# Patient Record
Sex: Female | Born: 1980 | Race: White | Hispanic: No | Marital: Married | Smoking: Never smoker
Health system: Southern US, Community
[De-identification: ages and names within clinical notes are randomized; demographics above are authoritative.]

## PROBLEM LIST (undated history)

## (undated) DIAGNOSIS — Z1589 Genetic susceptibility to other disease: Secondary | ICD-10-CM

## (undated) DIAGNOSIS — F419 Anxiety disorder, unspecified: Secondary | ICD-10-CM

## (undated) DIAGNOSIS — D649 Anemia, unspecified: Secondary | ICD-10-CM

## (undated) DIAGNOSIS — N939 Abnormal uterine and vaginal bleeding, unspecified: Secondary | ICD-10-CM

## (undated) DIAGNOSIS — D6859 Other primary thrombophilia: Secondary | ICD-10-CM

## (undated) DIAGNOSIS — I10 Essential (primary) hypertension: Secondary | ICD-10-CM

## (undated) DIAGNOSIS — Z8742 Personal history of other diseases of the female genital tract: Secondary | ICD-10-CM

## (undated) DIAGNOSIS — Z973 Presence of spectacles and contact lenses: Secondary | ICD-10-CM

## (undated) DIAGNOSIS — D239 Other benign neoplasm of skin, unspecified: Secondary | ICD-10-CM

## (undated) DIAGNOSIS — N644 Mastodynia: Secondary | ICD-10-CM

## (undated) DIAGNOSIS — Z9889 Other specified postprocedural states: Secondary | ICD-10-CM

## (undated) DIAGNOSIS — R21 Rash and other nonspecific skin eruption: Secondary | ICD-10-CM

## (undated) DIAGNOSIS — R112 Nausea with vomiting, unspecified: Secondary | ICD-10-CM

## (undated) HISTORY — DX: Anxiety disorder, unspecified: F41.9

## (undated) HISTORY — DX: Other benign neoplasm of skin, unspecified: D23.9

## (undated) HISTORY — PX: BREAST ENHANCEMENT SURGERY: SHX7

## (undated) HISTORY — DX: Mastodynia: N64.4

## (undated) HISTORY — PX: TUBAL LIGATION: SHX77

## (undated) HISTORY — PX: MOLE REMOVAL: SHX2046

## (undated) HISTORY — DX: Personal history of other diseases of the female genital tract: Z87.42

---

## 2007-02-25 HISTORY — PX: LASER ABLATION OF THE CERVIX: SHX1949

## 2014-08-10 LAB — HM PAP SMEAR: HM PAP: NEGATIVE

## 2015-05-10 LAB — HM MAMMOGRAPHY

## 2017-04-17 ENCOUNTER — Ambulatory Visit (INDEPENDENT_AMBULATORY_CARE_PROVIDER_SITE_OTHER): Payer: No Typology Code available for payment source | Admitting: Physician Assistant

## 2017-04-17 ENCOUNTER — Encounter (INDEPENDENT_AMBULATORY_CARE_PROVIDER_SITE_OTHER): Payer: Self-pay

## 2017-04-17 ENCOUNTER — Encounter: Payer: Self-pay | Admitting: Physician Assistant

## 2017-04-17 VITALS — BP 114/78 | HR 71 | Ht 62.0 in | Wt 127.0 lb

## 2017-04-17 DIAGNOSIS — Z131 Encounter for screening for diabetes mellitus: Secondary | ICD-10-CM

## 2017-04-17 DIAGNOSIS — Z8742 Personal history of other diseases of the female genital tract: Secondary | ICD-10-CM | POA: Insufficient documentation

## 2017-04-17 DIAGNOSIS — Z130 Encounter for screening for diseases of the blood and blood-forming organs and certain disorders involving the immune mechanism: Secondary | ICD-10-CM

## 2017-04-17 DIAGNOSIS — Z0189 Encounter for other specified special examinations: Secondary | ICD-10-CM | POA: Diagnosis not present

## 2017-04-17 DIAGNOSIS — Z13220 Encounter for screening for lipoid disorders: Secondary | ICD-10-CM

## 2017-04-17 DIAGNOSIS — Z800 Family history of malignant neoplasm of digestive organs: Secondary | ICD-10-CM | POA: Insufficient documentation

## 2017-04-17 DIAGNOSIS — Z87898 Personal history of other specified conditions: Secondary | ICD-10-CM

## 2017-04-17 DIAGNOSIS — D225 Melanocytic nevi of trunk: Secondary | ICD-10-CM | POA: Diagnosis not present

## 2017-04-17 DIAGNOSIS — Z7689 Persons encountering health services in other specified circumstances: Secondary | ICD-10-CM | POA: Diagnosis not present

## 2017-04-17 DIAGNOSIS — Z008 Encounter for other general examination: Secondary | ICD-10-CM

## 2017-04-17 DIAGNOSIS — Z1329 Encounter for screening for other suspected endocrine disorder: Secondary | ICD-10-CM

## 2017-04-17 DIAGNOSIS — N644 Mastodynia: Secondary | ICD-10-CM

## 2017-04-17 NOTE — Progress Notes (Signed)
HPI:                                                                Jacqueline Huerta is a 37 y.o. female who presents to [facility]: [facility] today to establish care  Current concerns: biometric screening, abnormal mole  Mole: patient reports mole on her upper left back that has been present for an unknown amount of time. Husband noticed it and felt it was abnormal because it does not look like her other moles. It is nontender, non-pruritic, does not bleed. She has a history of dysplastic nevus on her back that was removed years ago.   Depression screen [facility] 2/9 04/17/2017  Decreased Interest 0  Down, Depressed, Hopeless 0  PHQ - 2 Score 0    No flowsheet data found.    Past Medical History:  Diagnosis Date  . Anxiety   . Breast tenderness in female   . Dysplastic nevus   . History of abnormal cervical Pap smear    Past Surgical History:  Procedure Laterality Date  . BREAST ENHANCEMENT SURGERY    . CESAREAN SECTION     x 2  . LASER ABLATION OF THE CERVIX  2009  . MOLE REMOVAL    . TUBAL LIGATION     Social History   Tobacco Use  . Smoking status: Never Smoker  . Smokeless tobacco: Never Used  Substance Use Topics  . Alcohol use: Yes    Alcohol/week: 0.6 oz    Types: 1 Standard drinks or equivalent per week   family history includes Alcohol abuse in her father; Colon cancer in her paternal grandmother; Heart attack in her maternal grandfather; Hyperlipidemia in her mother; Lung cancer in her maternal aunt.    ROS: Review of Systems  Cardiovascular: Positive for palpitations.  Musculoskeletal:       + b/l breast tenderness  Psychiatric/Behavioral: The patient is nervous/anxious.   All other systems reviewed and are negative.    Medications: Current Outpatient Medications  Medication Sig Dispense Refill  . Multiple Vitamins-Minerals (WOMENS MULTI PO) Take by mouth.     No current facility-administered medications  for this visit.    Allergies  Allergen Reactions  . Nitrofurantoin Nausea And Vomiting       Objective:  BP 114/78   Pulse 71   Ht 5\' 2"  (1.575 m)   Wt 127 lb (57.6 kg)   LMP 04/11/2017   BMI 23.23 kg/m  Gen:  alert, not ill-appearing, no distress, appropriate for age 45: head normocephalic without obvious abnormality, conjunctiva and cornea clear, trachea midline Pulm: Normal work of breathing, normal phonation Neuro: alert and oriented x 3, no tremor MSK: extremities atraumatic, normal gait and station Skin: approx 60mm pearly papule on the left upper back, well-healed excisional scar adjacent to the left thoracic spine, areas of sun damage are noted with scattered lentigines, brown flat macules, pigmented lesions with regular borders are noted on the posterior trunk Psych: well-groomed, cooperative, good eye contact, euthymic mood, affect mood-congruent, speech is articulate, and thought processes clear and goal-directed    No results found for this or any previous visit (from the past 72 hour(s)). No results found.    Assessment and Plan: 37 y.o. female with  1. Encounter to establish care - reviewed PMH, PSH, PFH, medications and allergies - reviewed health maintenance - Pap smear UTD per patient, requesting records from Dr. Evelina Bucy ([place]) - declines influenza - Tdap UTD per patient - negative PHQ2  2. Encounter for biometric screening - CBC - Comprehensive metabolic panel - Lipid Panel w/reflex Direct LDL - Nicotine and Cotinine LC/MS/MS, U  3. Family history of colon cancer - grandparent, no first degree relatives, no hereditary polyposis, routine screening beginning age 75  4. Atypical nevus of back - differential includes dermal nevus versus SCC/BCC. Given history of atypical nevi will refer to Derm for annual skin check - Ambulatory referral to Dermatology  5. History of abnormal cervical Pap smear - 10 years ago, recent Paps normal per  patient, requesting records - she would like to do annual screening due to her history. She understands this is outside of the clinical guidelines and she may have to pay for it  6. Breast tenderness in female - chronic tenderness in bilateral breasts. Augmentation surgery was 12 years ago. She reports normal mammograms and ultrasounds x 2 (requesting records). She plans to follow-up with her plastic surgeon in [place]  7. Screening for thyroid disorder - TSH + free T4  8. Screening for lipid disorders - Lipid Panel w/reflex Direct LDL  9. Screening for diabetes mellitus - Comprehensive metabolic panel  10. Screening for blood disease - CBC - Comprehensive metabolic panel   Patient education and anticipatory guidance given Patient agrees with treatment plan Follow-up in 1 month for Pap smear or sooner as needed if symptoms worsen or fail to improve  Darlyne Russian PA-C

## 2017-04-22 LAB — NICOTINE AND COTININE LC/MS/MS, U: NICOTINE, URINE: <2 ng/mL

## 2017-04-22 LAB — COMPREHENSIVE METABOLIC PANEL
AG Ratio: 1.8 (calc) (ref 1.0–2.5)
ALT: 14 U/L (ref 6–29)
AST: 16 U/L (ref 10–30)
Albumin: 4.6 g/dL (ref 3.6–5.1)
Alkaline phosphatase (APISO): 54 U/L (ref 33–115)
BUN: 12 mg/dL (ref 7–25)
CO2: 26 mmol/L (ref 20–32)
CREATININE: 0.84 mg/dL (ref 0.50–1.10)
Calcium: 9.5 mg/dL (ref 8.6–10.2)
Chloride: 104 mmol/L (ref 98–110)
GLUCOSE: 87 mg/dL (ref 65–99)
Globulin: 2.5 g/dL (calc) (ref 1.9–3.7)
Potassium: 4.5 mmol/L (ref 3.5–5.3)
SODIUM: 138 mmol/L (ref 135–146)
TOTAL PROTEIN: 7.1 g/dL (ref 6.1–8.1)
Total Bilirubin: 0.5 mg/dL (ref 0.2–1.2)

## 2017-04-22 LAB — CBC
HCT: 43.5 % (ref 35.0–45.0)
HEMOGLOBIN: 14.3 g/dL (ref 11.7–15.5)
MCH: 29.5 pg (ref 27.0–33.0)
MCHC: 32.9 g/dL (ref 32.0–36.0)
MCV: 89.7 fL (ref 80.0–100.0)
MPV: 13.3 fL — ABNORMAL HIGH (ref 7.5–12.5)
PLATELETS: 181 10*3/uL (ref 140–400)
RBC: 4.85 10*6/uL (ref 3.80–5.10)
RDW: 11.7 % (ref 11.0–15.0)
WBC: 4.4 10*3/uL (ref 3.8–10.8)

## 2017-04-22 LAB — LIPID PANEL W/REFLEX DIRECT LDL
Cholesterol: 131 mg/dL (ref <200)
HDL: 58 mg/dL (ref >50)
LDL CHOLESTEROL (CALC): 60 mg/dL
NON-HDL CHOLESTEROL (CALC): 73 mg/dL (ref <130)
TRIGLYCERIDES: 45 mg/dL (ref <150)
Total CHOL/HDL Ratio: 2.3 (calc) (ref <5.0)

## 2017-04-22 LAB — TSH+FREE T4: TSH W/REFLEX TO FT4: 0.96 m[IU]/L

## 2017-04-23 NOTE — Progress Notes (Signed)
Good morning Ashonti,  Your labs look great - normal kidney function - cholesterol in a healthy range - normal blood counts - no evidence of diabetes  Your form and results will be faxed to your employer and you can pick-up a hard-copy from the front desk by end of day today.  Best, Evlyn Clines

## 2017-05-15 ENCOUNTER — Ambulatory Visit: Payer: No Typology Code available for payment source | Admitting: Physician Assistant

## 2017-05-21 ENCOUNTER — Ambulatory Visit: Payer: No Typology Code available for payment source | Admitting: Physician Assistant

## 2017-05-29 ENCOUNTER — Other Ambulatory Visit (HOSPITAL_COMMUNITY)
Admission: RE | Admit: 2017-05-29 | Discharge: 2017-05-29 | Disposition: A | Discharge status: Home / Self Care | Payer: No Typology Code available for payment source | Source: Ambulatory Visit | Attending: Physician Assistant | Admitting: Physician Assistant

## 2017-05-29 ENCOUNTER — Encounter: Payer: Self-pay | Admitting: Physician Assistant

## 2017-05-29 ENCOUNTER — Ambulatory Visit (INDEPENDENT_AMBULATORY_CARE_PROVIDER_SITE_OTHER): Payer: No Typology Code available for payment source | Admitting: Physician Assistant

## 2017-05-29 VITALS — BP 118/78 | HR 76 | Wt 123.0 lb

## 2017-05-29 DIAGNOSIS — N644 Mastodynia: Secondary | ICD-10-CM

## 2017-05-29 DIAGNOSIS — Z9851 Tubal ligation status: Secondary | ICD-10-CM | POA: Diagnosis not present

## 2017-05-29 DIAGNOSIS — N923 Ovulation bleeding: Secondary | ICD-10-CM | POA: Diagnosis not present

## 2017-05-29 DIAGNOSIS — N926 Irregular menstruation, unspecified: Secondary | ICD-10-CM | POA: Insufficient documentation

## 2017-05-29 DIAGNOSIS — Z124 Encounter for screening for malignant neoplasm of cervix: Secondary | ICD-10-CM | POA: Diagnosis not present

## 2017-05-29 NOTE — Patient Instructions (Signed)

## 2017-05-29 NOTE — Progress Notes (Signed)
HPI:                                                                Jacqueline Huerta is a 37 y.o. female who presents to [place]: [place] today for Pap smear only  Current Concerns include:  History of breast augmentation 13 years ago. Reports history of right lower breast with palpable "pea-sized" mass just below the nipple. She has had negative mammogram and ultrasound. Reports no changes in the area of concern; she is actually not able to palpate it any more. Breasts are chronically tender in the inferior aspect bilaterally.    GYN/Sexual Health  Obstetrics: G2P2  Menstrual status: having periods  LMP: 05/07/17  Menses: irregular, monthly, some intermenstrual spotting  Last pap smear: 2 years ago, normal  History of abnormal pap smears: yes >10 years ago  Sexually active: yes, 1 female partner (husband)  Current contraception: tubal ligation  History of STI: no  Health Maintenance Health Maintenance  Topic Date Due  . PAP SMEAR  01/31/2004  . INFLUENZA VACCINE  12/18/2017 (Originally 09/24/2017)  . TETANUS/TDAP  04/17/2020    Past Medical History:  Diagnosis Date  . Anxiety   . Breast tenderness in female   . Dysplastic nevus   . History of abnormal cervical Pap smear    Past Surgical History:  Procedure Laterality Date  . BREAST ENHANCEMENT SURGERY    . CESAREAN SECTION     x 2  . LASER ABLATION OF THE CERVIX  2009  . MOLE REMOVAL    . TUBAL LIGATION     Social History   Tobacco Use  . Smoking status: Never Smoker  . Smokeless tobacco: Never Used  Substance Use Topics  . Alcohol use: Yes    Alcohol/week: 0.6 oz    Types: 1 Standard drinks or equivalent per week   family history includes Alcohol abuse in her father; Colon cancer in her paternal grandmother; Heart attack in her maternal grandfather; Hyperlipidemia in her mother; Lung cancer in her maternal aunt.  ROS: negative except as noted in the  HPI  Medications: Current Outpatient Medications  Medication Sig Dispense Refill  . Multiple Vitamins-Minerals (WOMENS MULTI PO) Take by mouth.     No current facility-administered medications for this visit.    Allergies  Allergen Reactions  . Nitrofurantoin Nausea And Vomiting       Objective:  BP 118/78   Pulse 76   Wt 123 lb (55.8 kg)   BMI 22.50 kg/m  Breast: bilateral implants present, no palpable masses, no skin dimpling, no nipple inversion GU: vulva without rashes or lesions, normal introitus and urethral meatus, vaginal mucosa without erythema, normal discharge, cervix non-friable without lesions Lymph: no axillary adenopathy  A chaperone was used for the Breast/GU portion of the exam, Izell Wheatland, CMA.    No results found for this or any previous visit (from the past 72 hour(s)). No results found.    Assessment and Plan: 37 y.o. female with   Encounter for Pap smear of cervix with HPV DNA cotesting - Plan: Cytology - PAP  Intermenstrual spotting  History of bilateral tubal ligation  Declines STI screening Will trial evening primrose oil for breast tenderness Plans to follow-up with her  Psychiatric nurse in [place]. Declines referral Patient education and anticipatory guidance given Patient agrees with treatment plan Follow-up based on Pap results or sooner as needed  Darlyne Russian PA-C

## 2017-06-02 LAB — CYTOLOGY - PAP
ADEQUACY: ABSENT
Diagnosis: NEGATIVE
HPV (WINDOPATH): NOT DETECTED

## 2017-06-02 NOTE — Progress Notes (Signed)
Hi Juliah,  Your Pap smear and HPV testing were both negative. Recommend repeat screening in 5 years.  Best, Evlyn Clines

## 2017-06-03 ENCOUNTER — Encounter: Payer: Self-pay | Admitting: Physician Assistant

## 2017-06-09 ENCOUNTER — Encounter: Payer: Self-pay | Admitting: Physician Assistant

## 2017-06-09 NOTE — Progress Notes (Signed)
Negative for intraepithelial lesion or malignancy.  

## 2017-06-11 ENCOUNTER — Telehealth: Payer: Self-pay

## 2017-06-11 NOTE — Telephone Encounter (Signed)
CMA call patient regarding missed called form patient wanted to know her PAP results   Patient did not answer but CMA left a detailed message per DPR & to call back if have any questions

## 2018-04-16 ENCOUNTER — Other Ambulatory Visit: Payer: Self-pay | Admitting: Physician Assistant

## 2018-04-16 DIAGNOSIS — Z13220 Encounter for screening for lipoid disorders: Secondary | ICD-10-CM

## 2018-04-16 DIAGNOSIS — Z130 Encounter for screening for diseases of the blood and blood-forming organs and certain disorders involving the immune mechanism: Secondary | ICD-10-CM

## 2018-04-16 DIAGNOSIS — Z0000 Encounter for general adult medical examination without abnormal findings: Secondary | ICD-10-CM

## 2018-04-16 DIAGNOSIS — Z131 Encounter for screening for diabetes mellitus: Secondary | ICD-10-CM

## 2018-04-17 LAB — COMPLETE METABOLIC PANEL WITH GFR
AG RATIO: 2.1 (calc) (ref 1.0–2.5)
ALT: 12 U/L (ref 6–29)
AST: 16 U/L (ref 10–30)
Albumin: 4.8 g/dL (ref 3.6–5.1)
Alkaline phosphatase (APISO): 55 U/L (ref 31–125)
BUN: 10 mg/dL (ref 7–25)
CO2: 28 mmol/L (ref 20–32)
Calcium: 9.6 mg/dL (ref 8.6–10.2)
Chloride: 103 mmol/L (ref 98–110)
Creat: 0.8 mg/dL (ref 0.50–1.10)
GFR, Est African American: 111 mL/min/{1.73_m2} (ref >=60)
GFR, Est Non African American: 96 mL/min/{1.73_m2} (ref >=60)
Globulin: 2.3 g/dL (calc) (ref 1.9–3.7)
Glucose, Bld: 78 mg/dL (ref 65–99)
POTASSIUM: 4.3 mmol/L (ref 3.5–5.3)
Sodium: 140 mmol/L (ref 135–146)
Total Bilirubin: 0.5 mg/dL (ref 0.2–1.2)
Total Protein: 7.1 g/dL (ref 6.1–8.1)

## 2018-04-17 LAB — LIPID PANEL W/REFLEX DIRECT LDL
Cholesterol: 127 mg/dL (ref <200)
HDL: 67 mg/dL (ref >=50)
LDL CHOLESTEROL (CALC): 47 mg/dL
Non-HDL Cholesterol (Calc): 60 mg/dL (calc) (ref <130)
TRIGLYCERIDES: 45 mg/dL (ref <150)
Total CHOL/HDL Ratio: 1.9 (calc) (ref <5.0)

## 2018-04-17 LAB — CBC
HCT: 42 % (ref 35.0–45.0)
Hemoglobin: 13.9 g/dL (ref 11.7–15.5)
MCH: 30.3 pg (ref 27.0–33.0)
MCHC: 33.1 g/dL (ref 32.0–36.0)
MCV: 91.7 fL (ref 80.0–100.0)
MPV: 13.4 fL — ABNORMAL HIGH (ref 7.5–12.5)
Platelets: 173 10*3/uL (ref 140–400)
RBC: 4.58 10*6/uL (ref 3.80–5.10)
RDW: 11.7 % (ref 11.0–15.0)
WBC: 4.1 10*3/uL (ref 3.8–10.8)

## 2018-04-21 ENCOUNTER — Ambulatory Visit (INDEPENDENT_AMBULATORY_CARE_PROVIDER_SITE_OTHER): Payer: No Typology Code available for payment source | Admitting: Physician Assistant

## 2018-04-21 ENCOUNTER — Ambulatory Visit (INDEPENDENT_AMBULATORY_CARE_PROVIDER_SITE_OTHER): Payer: No Typology Code available for payment source

## 2018-04-21 ENCOUNTER — Encounter: Payer: Self-pay | Admitting: Physician Assistant

## 2018-04-21 VITALS — BP 122/70 | HR 111 | Temp 98.3°F | Wt 129.0 lb

## 2018-04-21 DIAGNOSIS — N926 Irregular menstruation, unspecified: Secondary | ICD-10-CM

## 2018-04-21 DIAGNOSIS — R110 Nausea: Secondary | ICD-10-CM | POA: Diagnosis not present

## 2018-04-21 DIAGNOSIS — R1013 Epigastric pain: Secondary | ICD-10-CM

## 2018-04-21 DIAGNOSIS — R000 Tachycardia, unspecified: Secondary | ICD-10-CM

## 2018-04-21 DIAGNOSIS — R102 Pelvic and perineal pain unspecified side: Secondary | ICD-10-CM

## 2018-04-21 DIAGNOSIS — Z0189 Encounter for other specified special examinations: Secondary | ICD-10-CM

## 2018-04-21 DIAGNOSIS — Z0000 Encounter for general adult medical examination without abnormal findings: Secondary | ICD-10-CM | POA: Diagnosis not present

## 2018-04-21 LAB — POCT URINALYSIS DIPSTICK
Bilirubin, UA: NEGATIVE
Blood, UA: NEGATIVE
Glucose, UA: NEGATIVE
Ketones, UA: NEGATIVE
Leukocytes, UA: NEGATIVE
Nitrite, UA: NEGATIVE
PH UA: 7 (ref 5.0–8.0)
Protein, UA: NEGATIVE
Spec Grav, UA: 1.015 (ref 1.010–1.025)
Urobilinogen, UA: 0.2 E.U./dL

## 2018-04-21 LAB — POCT URINE PREGNANCY: PREG TEST UR: NEGATIVE

## 2018-04-21 MED ORDER — PANTOPRAZOLE SODIUM 40 MG PO TBEC: 40.0000 mg | DELAYED_RELEASE_TABLET | Freq: Every day | ORAL | 0 refills | Status: DC

## 2018-04-21 NOTE — Patient Instructions (Addendum)
Pelvic Pain, Female Pelvic pain is pain in your lower abdomen, below your belly button and between your hips. The pain may start suddenly (be acute), keep coming back (be recurring), or last a long time (become chronic). Pelvic pain that lasts longer than 6 months is considered chronic. Pelvic pain may affect your:  Reproductive organs.  Urinary system.  Digestive tract.  Musculoskeletal system. There are many potential causes of pelvic pain. Sometimes, the pain can be a result of digestive or urinary conditions, strained muscles or ligaments, or reproductive conditions. Sometimes the cause of pelvic pain is not known. Follow these instructions at home:   Take over-the-counter and prescription medicines only as told by your health care provider.  Rest as told by your health care provider.  Do not have sex if it hurts.  Keep a journal of your pelvic pain. Write down: ? When the pain started. ? Where the pain is located. ? What seems to make the pain better or worse, such as food or your period (menstrual cycle). ? Any symptoms you have along with the pain.  Keep all follow-up visits as told by your health care provider. This is important. Contact a health care provider if:  Medicine does not help your pain.  Your pain comes back.  You have new symptoms.  You have abnormal vaginal discharge or bleeding, including bleeding after menopause.  You have a fever or chills.  You are constipated.  You have blood in your urine or stool.  You have foul-smelling urine.  You feel weak or light-headed. Get help right away if:  You have sudden severe pain.  Your pain gets steadily worse.  You have severe pain along with fever, nausea, vomiting, or excessive sweating.  You lose consciousness. Summary  Pelvic pain is pain in your lower abdomen, below your belly button and between your hips.  There are many potential causes of pelvic pain.  Keep a journal of your pelvic  pain. This information is not intended to replace advice given to you by your health care provider. Make sure you discuss any questions you have with your health care provider. Document Released: 01/08/2004 Document Revised: 07/29/2017 Document Reviewed: 07/29/2017 Elsevier Interactive Patient Education  2019 [place] for Gastroesophageal Reflux Disease, Adult When you have gastroesophageal reflux disease (GERD), the foods you eat and your eating habits are very important. Choosing the right foods can help ease the discomfort of GERD. Consider working with a diet and nutrition specialist (dietitian) to help you make healthy food choices. What general guidelines should I follow?  Eating plan  Choose healthy foods low in fat, such as fruits, vegetables, whole grains, low-fat dairy products, and lean meat, fish, and poultry.  Eat frequent, small meals instead of three large meals each day. Eat your meals slowly, in a relaxed setting. Avoid bending over or lying down until 2-3 hours after eating.  Limit high-fat foods such as fatty meats or fried foods.  Limit your intake of oils, butter, and shortening to less than 8 teaspoons each day.  Avoid the following: ? Foods that cause symptoms. These may be different for different people. Keep a food diary to keep track of foods that cause symptoms. ? Alcohol. ? Drinking large amounts of liquid with meals. ? Eating meals during the 2-3 hours before bed.  Cook foods using methods other than frying. This may include baking, grilling, or broiling. Lifestyle  Maintain a healthy weight. Ask your health care  provider what weight is healthy for you. If you need to lose weight, work with your health care provider to do so safely.  Exercise for at least 30 minutes on 5 or more days each week, or as told by your health care provider.  Avoid wearing clothes that fit tightly around your waist and chest.  Do not use any products  that contain nicotine or tobacco, such as cigarettes and e-cigarettes. If you need help quitting, ask your health care provider.  Sleep with the head of your bed raised. Use a wedge under the mattress or blocks under the bed frame to raise the head of the bed. What foods are not recommended? The items listed may not be a complete list. Talk with your dietitian about what dietary choices are best for you. Grains Pastries or quick breads with added fat. [place] toast. Vegetables Deep fried vegetables. [place] fries. Any vegetables prepared with added fat. Any vegetables that cause symptoms. For some people this may include tomatoes and tomato products, chili peppers, onions and garlic, and horseradish. Fruits Any fruits prepared with added fat. Any fruits that cause symptoms. For some people this may include citrus fruits, such as oranges, grapefruit, pineapple, and lemons. Meats and other protein foods High-fat meats, such as fatty beef or pork, hot dogs, ribs, ham, sausage, salami and bacon. Fried meat or protein, including fried fish and fried chicken. Nuts and nut butters. Dairy Whole milk and chocolate milk. Sour cream. Cream. Ice cream. Cream cheese. Milk shakes. Beverages Coffee and tea, with or without caffeine. Carbonated beverages. Sodas. Energy drinks. Fruit juice made with acidic fruits (such as orange or grapefruit). Tomato juice. Alcoholic drinks. Fats and oils Butter. Margarine. Shortening. Ghee. Sweets and desserts Chocolate and cocoa. Donuts. Seasoning and other foods Pepper. Peppermint and spearmint. Any condiments, herbs, or seasonings that cause symptoms. For some people, this may include curry, hot sauce, or vinegar-based salad dressings. Summary  When you have gastroesophageal reflux disease (GERD), food and lifestyle choices are very important to help ease the discomfort of GERD.  Eat frequent, small meals instead of three large meals each day. Eat your meals slowly, in  a relaxed setting. Avoid bending over or lying down until 2-3 hours after eating.  Limit high-fat foods such as fatty meat or fried foods. This information is not intended to replace advice given to you by your health care provider. Make sure you discuss any questions you have with your health care provider. Document Released: 02/10/2005 Document Revised: 02/12/2016 Document Reviewed: 02/12/2016 Elsevier Interactive Patient Education  2019 [employer].

## 2018-04-21 NOTE — Progress Notes (Signed)
HPI:                                                                Jacqueline Huerta is a 38 y.o. female who presents to [place]: [facility] today for annual physical exam  Current Concerns include menstrual period  G2P2 LMP 04/14/18 Reports last 2 menstrual cycles (January and February) were lighter and shorter, lasting only about 1 day with spotting for an additional 1-2 days. Cycles were only 21 days apart where they are normally 4 weeks apart Typical menses lasts 7 days Has had increased breast tenderness/swelling a couple of weeks prior to the start of her period. This is different from her typical breast tenderness. Denies any masses or nipple discharge. Increased acne/breakouts and feeling more emotional Once a week having a sharp, left-sided pelvic pain that "takes my breath away," but only lasts several seconds Today she is feeling nauseated Took a negative home pregnancy in January No dyspareunia or post-coital bleeding No intermenstrual bleeding or spotting  Health Maintenance Health Maintenance  Topic Date Due  . INFLUENZA VACCINE  05/25/2018 (Originally 09/24/2017)  . TETANUS/TDAP  04/17/2020  . PAP SMEAR-Modifier  05/30/2022    Past Medical History:  Diagnosis Date  . Anxiety   . Breast tenderness in female   . Dysplastic nevus   . History of abnormal cervical Pap smear    Past Surgical History:  Procedure Laterality Date  . BREAST ENHANCEMENT SURGERY    . CESAREAN SECTION     x 2  . LASER ABLATION OF THE CERVIX  2009  . MOLE REMOVAL    . TUBAL LIGATION     Social History   Tobacco Use  . Smoking status: Never Smoker  . Smokeless tobacco: Never Used  Substance Use Topics  . Alcohol use: Yes    Alcohol/week: 1.0 standard drinks    Types: 1 Standard drinks or equivalent per week   family history includes Alcohol abuse in her father; Colon cancer in her paternal grandmother; Heart attack in her maternal  grandfather; Hyperlipidemia in her mother; Lung cancer in her maternal aunt.  ROS: negative except as noted in the HPI  Medications: Current Outpatient Medications  Medication Sig Dispense Refill  . Multiple Vitamins-Minerals (WOMENS MULTI PO) Take by mouth.    . pantoprazole (PROTONIX) 40 MG tablet Take 1 tablet (40 mg total) by mouth daily for 14 days. 30 tablet 0   No current facility-administered medications for this visit.    Allergies  Allergen Reactions  . Nitrofurantoin Nausea And Vomiting       Objective:  BP 136/79   Pulse (!) 114   Temp 98.3 F (36.8 C)   Wt 129 lb (58.5 kg)   LMP 04/14/2018   BMI 23.59 kg/m  General Appearance:  Alert, cooperative, no distress, appropriate for age                            Head:  Normocephalic, without obvious abnormality                             Eyes:  PERRL, EOM's intact, conjunctiva and cornea clear, wearing  contact lenses                             Ears:  TM pearly gray color and semitransparent, external ear canals normal, both ears                            Nose:  Nares symmetrical, mucosa pink                          Throat:  Lips, tongue, and mucosa are moist, pink, and intact; oropharynx clear, uvula midline; good dentition                             Neck:  Supple; symmetrical, trachea midline, no adenopathy; thyroid: no enlargement, symmetric, no tenderness/mass/nodules                             Back:  Symmetrical, no curvature, ROM normal               Chest/Breast: deferred                           Lungs:  Clear to auscultation bilaterally, respirations unlabored                             Heart:  Tachycardic rate & regular rhythm, S1 and S2 normal, no murmurs, rubs, or gallops                     Abdomen:  Soft, epigastric and LUQ tenderness as well as LLQ tenderness, no guarding or rigidity, no CVA tenderness         Musculoskeletal:  Tone and strength strong and symmetrical, all extremities; no joint  pain or edema, normal gait and station                            Lymphatic:  No adenopathy             Skin/Hair/Nails:  Skin warm, dry and intact, no rashes or abnormal dyspigmentation on limited exam                   Neurologic:  Alert and oriented x3, no cranial nerve deficits, sensation grossly intact, normal gait and station, no tremor Psych: well-groomed, cooperative, good eye contact, euthymic mood, affect mood-congruent, speech is articulate, and thought processes clear and goal-directed   Lab Results  Component Value Date   CREATININE 0.80 04/16/2018   BUN 10 04/16/2018   NA 140 04/16/2018   K 4.3 04/16/2018   CL 103 04/16/2018   CO2 28 04/16/2018   Lab Results  Component Value Date   ALT 12 04/16/2018   AST 16 04/16/2018   BILITOT 0.5 04/16/2018   Lab Results  Component Value Date   CHOL 127 04/16/2018   HDL 67 04/16/2018   LDLCALC 47 04/16/2018   TRIG 45 04/16/2018   CHOLHDL 1.9 04/16/2018     No results found for this or any previous visit (from the past 72 hour(s)). No results found.    Assessment and Plan: 38 y.o. female with   .Tamiya was  seen today for annual exam.  Diagnoses and all orders for this visit:  Encounter for annual physical exam  Encounter for screening for tobacco use -     Nicotine and Cotinine LC/MS/MS, U  Pelvic pain in female -     US PELVIC COMPLETE W TRANSVAGINAL AND TORSION R/O  Menstrual cycle disorder  Epigastric pain -     pantoprazole (PROTONIX) 40 MG tablet; Take 1 tablet (40 mg total) by mouth daily for 14 days.  Nausea without vomiting -     pantoprazole (PROTONIX) 40 MG tablet; Take 1 tablet (40 mg total) by mouth daily for 14 days.   - Personally reviewed PMH, PSH, PFH, medications, allergies, HM - Age-appropriate cancer screening: Pap UTD, 05/29/17 NILM, HPV negative - Influenza declined - Tdap UTD - PHQ2 negative - Routine fasting labs completed, reviewed with patient, no abnormalities  Pelvic  pain UA negative Urine hCG negative [place] ordered and scheduled for this afternoon  Nausea and epigastric pain Normal CMP and CBC Protonix 40 mg QD x 2 weeks  GERD diet  Tachycardia Patient admits to feeling nervous and drinking caffeine before this visit Counseled to reduce caffeine consumption, hydrate Recheck in 2 weeks  Patient education and anticipatory guidance given Patient agrees with treatment plan Follow-up in 2 weeks for nausea/epigastric pain or sooner as needed  Darlyne Russian PA-C

## 2018-04-23 LAB — NICOTINE AND COTININE LC/MS/MS, U
COTININE, URINE: <2 ng/mL
NICOTINE, URINE: <2 ng/mL

## 2018-04-27 ENCOUNTER — Encounter: Payer: Self-pay | Admitting: Physician Assistant

## 2018-04-27 DIAGNOSIS — R110 Nausea: Secondary | ICD-10-CM | POA: Insufficient documentation

## 2018-04-27 DIAGNOSIS — R000 Tachycardia, unspecified: Secondary | ICD-10-CM | POA: Insufficient documentation

## 2018-04-27 DIAGNOSIS — R1013 Epigastric pain: Secondary | ICD-10-CM | POA: Insufficient documentation

## 2018-05-03 ENCOUNTER — Ambulatory Visit (INDEPENDENT_AMBULATORY_CARE_PROVIDER_SITE_OTHER): Payer: No Typology Code available for payment source | Admitting: Obstetrics & Gynecology

## 2018-05-03 ENCOUNTER — Encounter: Payer: Self-pay | Admitting: Obstetrics & Gynecology

## 2018-05-03 VITALS — BP 117/79 | HR 95 | Ht 62.0 in | Wt 126.0 lb

## 2018-05-03 DIAGNOSIS — N926 Irregular menstruation, unspecified: Secondary | ICD-10-CM

## 2018-05-03 DIAGNOSIS — R102 Pelvic and perineal pain: Secondary | ICD-10-CM | POA: Diagnosis not present

## 2018-05-03 DIAGNOSIS — Z9851 Tubal ligation status: Secondary | ICD-10-CM

## 2018-05-03 DIAGNOSIS — N9989 Other postprocedural complications and disorders of genitourinary system: Secondary | ICD-10-CM | POA: Diagnosis not present

## 2018-05-03 NOTE — Patient Instructions (Signed)
Return to clinic for any scheduled appointments or for any gynecologic concerns as needed.   

## 2018-05-03 NOTE — Progress Notes (Addendum)
GYNECOLOGY OFFICE VISIT NOTE   History:  Jacqueline Huerta is a 38 y.o. [id number] here today for evaluation of abnormal menstrual periods and pelvic pain. Accompanied by her husband. Reports normal menstrual periods until the last two months, had shortened period length (lasted only one day) and the one in January was associated with acne and breast tenderness. Also gets this pulling/shooting pain sensation on her left side periodically, recent ultrasound showed enlarged left ovarian vein (6.42mm) concerning for possible pelvic congestion syndrome.  Patient feels she getting periodic pains since her tubal ligation eight years ago, wants to know if this is related to her Filshie clips being "slipped off". Wants imaging to evaluate this.  Of note, patient also mentioned being interested in tubal reversal surgery.  She denies any current abnormal vaginal discharge, bleeding, pelvic pain or other concerns.    Past Medical History:  Diagnosis Date  . Anxiety   . Breast tenderness in female   . Dysplastic nevus   . History of abnormal cervical Pap smear     Past Surgical History:  Procedure Laterality Date  . BREAST ENHANCEMENT SURGERY    . CESAREAN SECTION     x 2  . LASER ABLATION OF THE CERVIX  2009  . MOLE REMOVAL    . TUBAL LIGATION      The following portions of the patient's history were reviewed and updated as appropriate: allergies, current medications, past family history, past medical history, past social history, past surgical history and problem list.   Health Maintenance:  Normal pap and negative HRHPV in 05/29/2017.    Review of Systems:  Pertinent items noted in HPI and remainder of comprehensive ROS otherwise negative.  Physical Exam:  BP 117/79   Pulse 95   Ht 5\' 2"  (1.575 m)   Wt 126 lb (57.2 kg)   LMP 04/14/2018   BMI 23.05 kg/m  CONSTITUTIONAL: Well-developed, well-nourished female in no acute distress.  HEENT:  Normocephalic, atraumatic. External right and left  ear normal. No scleral icterus.  NECK: Normal range of motion, supple, no masses noted on observation SKIN: No rash noted. Not diaphoretic. No erythema. No pallor. MUSCULOSKELETAL: Normal range of motion. No edema noted. NEUROLOGIC: Alert and oriented to person, place, and time. Normal muscle tone coordination. No cranial nerve deficit noted. PSYCHIATRIC: Normal mood and affect. Normal behavior. Normal judgment and thought content. CARDIOVASCULAR: Normal heart rate noted RESPIRATORY: Effort and breath sounds normal, no problems with respiration noted ABDOMEN: No masses noted. No other overt distention noted.   PELVIC: Deferred  Labs and Imaging No results found for this or any previous visit (from the past 168 hour(s)). US Pelvic Complete W Transvaginal And Torsion R/o  Result Date: 04/21/2018 CLINICAL DATA:  Initial evaluation for intermittent left-sided pelvic pain for 2 months. History of tubal ligation, ablation, C-section. EXAM: TRANSABDOMINAL AND TRANSVAGINAL ULTRASOUND OF PELVIS DOPPLER ULTRASOUND OF OVARIES TECHNIQUE: Both transabdominal and transvaginal ultrasound examinations of the pelvis were performed. Transabdominal technique was performed for global imaging of the pelvis including uterus, ovaries, adnexal regions, and pelvic cul-de-sac. It was necessary to proceed with endovaginal exam following the transabdominal exam to visualize the uterus, endometrium, and ovaries. Color and duplex Doppler ultrasound was utilized to evaluate blood flow to the ovaries. COMPARISON:  None. FINDINGS: Uterus Measurements: 6.4 x 3.9 x 4.8 cm = volume: 63 mL. No fibroids or other mass visualized. Endometrium Thickness: 15 mm.  No focal abnormality visualized. Right ovary Measurements: 2.9 x 2.0 x 2.3 cm =  volume: 7 mL. Normal appearance/no adnexal mass. 1.4 cm dominant follicle noted. Left ovary Measurements: 3.4 x 1.6 x 2.6 cm = volume: 7 mL. Normal appearance/no adnexal mass. Pulsed Doppler evaluation of  both ovaries demonstrates normal low-resistance arterial and venous waveforms. Other findings No abnormal free fluid. Prominent 6.1 mm vein noted within the left adnexa, raising the possibility for pelvic congestion syndrome. IMPRESSION: 1. Prominent vasculature within the left adnexa, raising the possibility for pelvic congestion syndrome. 2. Otherwise unremarkable and normal pelvic ultrasound for age. No evidence for ovarian torsion. Electronically Signed   By: Jeannine Boga M.D.   On: 04/21/2018 14:56     Assessment and Plan:     1. Menstrual cycle disorder Reassured patient that the shortening of her cycle could be secondary to stress or any other factors that can interfere with the hypothalamus-pituitary-ovarian-uterus axis. She wanted evaluation, lab evaluation done today. Will follow up results and manage accordingly. - Follicle stimulating hormone - TSH+Prl+TestT+TestF+17OHP - Hemoglobin A1c - B-HCG Quant  2. Pelvic pain in female 3. Post-tubal ligation syndrome Unclear if dilated vein is causing this, patient also worried about Filshie clips. Discussed post-tubal ligation syndrome of pelvic pain, occurs more duing periods. There is no imaging that is indicated to llok at Filshie clip location, they are likely on either ligated end of adjacent to ligated tubes.  Given patient's desire for possible tubal reversal surgery, she was referred to [facility] Kerin Perna). He may order imaging vs diagnostic laparoscopy to evaluate her tubes and will tell her exactly where her clips are at that point. If she does not proceed with this surgery, she can always return to get laparoscopy here and possible salpingectomy to help with her symptoms (if the pain is because of the Filshie clips). She was told there is a a large possibility pain would persist after this; also we do not offer any surgical intervention for dilated ovarian veins (oophorectomy not indicated). - Ambulatory referral to  Endocrinology  Return for any gynecologic concerns.    Total face-to-face time with patient: 30 minutes.  Over 50% of encounter was spent on counseling and coordination of care.   Verita Schneiders, MD, [place] [facility] [employer], [place]

## 2018-05-04 LAB — HEMOGLOBIN A1C
Hgb A1c MFr Bld: 5 % of total Hgb (ref <5.7)
Mean Plasma Glucose: 97 (calc)
eAG (mmol/L): 5.4 (calc)

## 2018-05-04 LAB — HCG, QUANTITATIVE, PREGNANCY

## 2018-05-04 LAB — FOLLICLE STIMULATING HORMONE: FSH: 3.2 m[IU]/mL

## 2018-05-06 LAB — TSH+PRL+TESTT+TESTF+17OHP
17 HYDROXYPROGESTERONE: 192 ng/dL
Prolactin: 10.6 ng/mL (ref 4.8–23.3)
TSH: 1.63 u[IU]/mL (ref 0.450–4.500)
Testosterone, Free: 3.9 pg/mL (ref 0.0–4.2)
Testosterone, Total, LC/MS: 36.8 ng/dL (ref 10.0–55.0)

## 2018-07-12 ENCOUNTER — Encounter: Payer: Self-pay | Admitting: Physician Assistant

## 2018-07-13 ENCOUNTER — Encounter: Payer: Self-pay | Admitting: Family Medicine

## 2018-07-13 ENCOUNTER — Ambulatory Visit (INDEPENDENT_AMBULATORY_CARE_PROVIDER_SITE_OTHER): Payer: BLUE CROSS/BLUE SHIELD | Admitting: Family Medicine

## 2018-07-13 VITALS — Ht 62.0 in | Wt 125.0 lb

## 2018-07-13 DIAGNOSIS — B373 Candidiasis of vulva and vagina: Secondary | ICD-10-CM

## 2018-07-13 DIAGNOSIS — B3731 Acute candidiasis of vulva and vagina: Secondary | ICD-10-CM

## 2018-07-13 MED ORDER — FLUCONAZOLE 150 MG PO TABS: 150.0000 mg | ORAL_TABLET | Freq: Once | ORAL | 1 refills | Status: AC

## 2018-07-13 NOTE — Progress Notes (Signed)
sxs x 3 days white discharge, burning itching.  She took AZO? for this and this did not work. Denies f/s/c/n/v/d. Pt is married.   Pt has Hx of endometriosis and PTLS that causes abdominal pain. She will have her surgery for this once pandemic is clear.Jacqueline Huerta, [place] Crocker

## 2018-07-13 NOTE — Progress Notes (Signed)
Virtual Visit via Video Note  I connected with Jacqueline Huerta on 07/13/18 at 10:30 AM EDT by a video enabled telemedicine application and verified that I am speaking with the correct person using two identifiers.   I discussed the limitations of evaluation and management by telemedicine and the availability of in person appointments. The patient expressed understanding and agreed to proceed.  Subjective:    CC:   HPI:  38 yo female c/o sxs x 3 days white discharge, burning itching.  She took AZO for yeast? for this and this did not work. Denies f/s/c/n/v/d. Pt is married. No dysuria.  No new sexual partners.    Pt has Hx of endometriosis and PTLS that causes abdominal pain. She will have her surgery for this once pandemic is clear   Past medical history, Surgical history, Family history not pertinant except as noted below, Social history, Allergies, and medications have been entered into the medical record, reviewed, and corrections made.   Review of Systems: No fevers, chills, night sweats, weight loss, chest pain, or shortness of breath.   Objective:    General: Speaking clearly in complete sentences without any shortness of breath.  Alert and oriented x3.  Normal judgment. No apparent acute distress.    Impression and Recommendations:   Vaginitis, yeast -symptoms seem very consistent with yeast vaginitis.  No red flag symptoms.  No fevers chills abdominal pain or new sexual partner.  We will go ahead and treat with Diflucan.  If she is not better in 2 to 3 days then please let us know and we will have her come in and do further evaluation including vaginal swab etc.  Does not sound she is having any urinary symptoms currently.     I discussed the assessment and treatment plan with the patient. The patient was provided an opportunity to ask questions and all were answered. The patient agreed with the plan and demonstrated an understanding of the instructions.   The patient was  advised to call back or seek an in-person evaluation if the symptoms worsen or if the condition fails to improve as anticipated.   Beatrice Lecher, MD

## 2018-08-17 ENCOUNTER — Telehealth: Payer: Self-pay | Admitting: *Deleted

## 2018-08-17 ENCOUNTER — Encounter: Payer: Self-pay | Admitting: Physician Assistant

## 2018-08-17 DIAGNOSIS — Z01818 Encounter for other preprocedural examination: Secondary | ICD-10-CM

## 2018-08-17 DIAGNOSIS — Z20822 Contact with and (suspected) exposure to covid-19: Secondary | ICD-10-CM

## 2018-08-17 NOTE — Telephone Encounter (Signed)
Pt referred for testing via MyChart by [facility]. Pt needs COVID-19 testing for pre op.  Pt called and scheduled for testing on 09/13/18 as requested by surgical facility.Pt given address and advised to wear a mask and remain in car at appt time. Pt verbalized understanding. Pt is due to have surgery on 09/16/18.Pt advised that typically results take 48-72 hours but may be longer. Pt will contact surgical facility to see if testing could be done earlier to ensure results are received before surgical date. Pt will return call tomorrow once she speaks with surgical facility.

## 2018-08-31 NOTE — Telephone Encounter (Addendum)
The pt called back and would like to have her testing done on 09/08/2018 due to the longer result time, and she needs them prior to her procedure on 09/17/2018; pt accepted appointment at [facility] site 09/08/2018 at 0800; pt reminded to wear mask to testing site; she verbalized understanding.

## 2018-09-08 ENCOUNTER — Other Ambulatory Visit: Payer: BLUE CROSS/BLUE SHIELD

## 2018-09-08 DIAGNOSIS — Z20822 Contact with and (suspected) exposure to covid-19: Secondary | ICD-10-CM

## 2018-09-13 ENCOUNTER — Other Ambulatory Visit: Payer: BLUE CROSS/BLUE SHIELD

## 2019-02-02 ENCOUNTER — Ambulatory Visit (INDEPENDENT_AMBULATORY_CARE_PROVIDER_SITE_OTHER): Payer: BC Managed Care – PPO | Admitting: Family Medicine

## 2019-02-02 ENCOUNTER — Encounter: Payer: Self-pay | Admitting: Family Medicine

## 2019-02-02 VITALS — Temp 97.7°F | Ht 62.0 in | Wt 129.0 lb

## 2019-02-02 DIAGNOSIS — R300 Dysuria: Secondary | ICD-10-CM

## 2019-02-02 MED ORDER — PHENAZOPYRIDINE HCL 200 MG PO TABS: 200.0000 mg | ORAL_TABLET | Freq: Three times a day (TID) | ORAL | 0 refills | Status: DC | PRN

## 2019-02-02 MED ORDER — AMOXICILLIN-POT CLAVULANATE 875-125 MG PO TABS: 1.0000 | ORAL_TABLET | Freq: Two times a day (BID) | ORAL | 0 refills | Status: DC

## 2019-02-02 NOTE — Progress Notes (Signed)
Virtual Visit via Video Note  I connected with Jacqueline Huerta on 02/02/19 at 10:50 AM EST by a video enabled telemedicine application and verified that I am speaking with the correct person using two identifiers.   I discussed the limitations of evaluation and management by telemedicine and the availability of in person appointments. The patient expressed understanding and agreed to proceed.  Subjective:    CC: dysuria   HPI:  Pt reports that her sxs night before last. She stated that it feels uncomfortable and painful after she urinates. She hasn't noticed any blood in her urine. Denies f/s/c/n/v, abdominal, back or flank pain.  Urine has an odor to it.    She informed me that she is trying to conceive and doesn't want anything to interfere with her trying to conceive. She asked about taking Pyridium for the bladder pain/pressure. No hx of recurrent UTIs.   She is allergic to Nitrofurantion -GI upset.   Past medical history, Surgical history, Family history not pertinant except as noted below, Social history, Allergies, and medications have been entered into the medical record, reviewed, and corrections made.   Review of Systems: No fevers, chills, night sweats, weight loss, chest pain, or shortness of breath.   Objective:    General: Speaking clearly in complete sentences without any shortness of breath.  Alert and oriented x3.  Normal judgment. No apparent acute distress.    Impression and Recommendations:    Dysuria-most consistent with urinary tract infection.  It does not sound recurrent.  No red flag symptoms.  We will go ahead and treat with Augmentin since she is trying to get pregnant.  We will also send her prescription for Pyridium.  If not improving please call us back so that we can get a urinalysis performed as well as culture.  If she develops new symptoms such as fever, nausea, or back pain then please call us sooner rather than later.  Sure hydrating  well.    I discussed the assessment and treatment plan with the patient. The patient was provided an opportunity to ask questions and all were answered. The patient agreed with the plan and demonstrated an understanding of the instructions.   The patient was advised to call back or seek an in-person evaluation if the symptoms worsen or if the condition fails to improve as anticipated.   Beatrice Lecher, MD

## 2019-02-02 NOTE — Progress Notes (Signed)
Pt reports that her sxs began yesterday. She stated that it feels uncomfortable and painful after she urinates. She hasn't noticed any blood in her urine. Denies f/s/c/n/v, abdominal,back or flank pain.  She informed me that she is trying to conceive and doesn't want anything to interfere with her trying to conceive. She asked about taking Pyridium for the bladder pain/pressure.  She is allergic to Nitrofurantion -GI upset.

## 2019-04-27 DIAGNOSIS — O09529 Supervision of elderly multigravida, unspecified trimester: Secondary | ICD-10-CM | POA: Insufficient documentation

## 2019-04-27 LAB — OB RESULTS CONSOLE HEPATITIS B SURFACE ANTIGEN: Hepatitis B Surface Ag: NEGATIVE

## 2019-04-27 LAB — OB RESULTS CONSOLE RPR: RPR: NONREACTIVE

## 2019-04-27 LAB — OB RESULTS CONSOLE ABO/RH: RH Type: POSITIVE

## 2019-04-27 LAB — OB RESULTS CONSOLE GC/CHLAMYDIA
Chlamydia: NEGATIVE
Gonorrhea: NEGATIVE

## 2019-04-27 LAB — OB RESULTS CONSOLE RUBELLA ANTIBODY, IGM: Rubella: IMMUNE

## 2019-04-27 LAB — OB RESULTS CONSOLE ANTIBODY SCREEN: Antibody Screen: NEGATIVE

## 2019-04-27 LAB — OB RESULTS CONSOLE HIV ANTIBODY (ROUTINE TESTING): HIV: NONREACTIVE

## 2019-08-02 ENCOUNTER — Inpatient Hospital Stay (HOSPITAL_COMMUNITY): Payer: BC Managed Care – PPO

## 2019-08-02 ENCOUNTER — Encounter (HOSPITAL_COMMUNITY): Payer: Self-pay | Admitting: Obstetrics and Gynecology

## 2019-08-02 ENCOUNTER — Inpatient Hospital Stay (HOSPITAL_COMMUNITY)
Admission: AD | Admit: 2019-08-02 | Discharge: 2019-08-02 | Disposition: A | Discharge status: Home / Self Care | Payer: BC Managed Care – PPO | Attending: Obstetrics and Gynecology | Admitting: Obstetrics and Gynecology

## 2019-08-02 ENCOUNTER — Other Ambulatory Visit: Payer: Self-pay

## 2019-08-02 DIAGNOSIS — R7401 Elevation of levels of liver transaminase levels: Secondary | ICD-10-CM | POA: Diagnosis not present

## 2019-08-02 DIAGNOSIS — R1010 Upper abdominal pain, unspecified: Secondary | ICD-10-CM

## 2019-08-02 DIAGNOSIS — R1011 Right upper quadrant pain: Secondary | ICD-10-CM | POA: Diagnosis not present

## 2019-08-02 DIAGNOSIS — O26892 Other specified pregnancy related conditions, second trimester: Secondary | ICD-10-CM | POA: Diagnosis present

## 2019-08-02 DIAGNOSIS — O09522 Supervision of elderly multigravida, second trimester: Secondary | ICD-10-CM | POA: Diagnosis not present

## 2019-08-02 DIAGNOSIS — Z3A23 23 weeks gestation of pregnancy: Secondary | ICD-10-CM

## 2019-08-02 LAB — COMPREHENSIVE METABOLIC PANEL
ALT: 59 U/L — ABNORMAL HIGH (ref 0–44)
AST: 38 U/L (ref 15–41)
Albumin: 3.3 g/dL — ABNORMAL LOW (ref 3.5–5.0)
Alkaline Phosphatase: 82 U/L (ref 38–126)
Anion gap: 9 (ref 5–15)
BUN: 6 mg/dL (ref 6–20)
CO2: 22 mmol/L (ref 22–32)
Calcium: 8.7 mg/dL — ABNORMAL LOW (ref 8.9–10.3)
Chloride: 104 mmol/L (ref 98–111)
Creatinine, Ser: 0.54 mg/dL (ref 0.44–1.00)
GFR calc Af Amer: >60 mL/min (ref >60)
GFR calc non Af Amer: >60 mL/min (ref >60)
Glucose, Bld: 79 mg/dL (ref 70–99)
Potassium: 3.6 mmol/L (ref 3.5–5.1)
Sodium: 135 mmol/L (ref 135–145)
Total Bilirubin: 0.4 mg/dL (ref 0.3–1.2)
Total Protein: 6.4 g/dL — ABNORMAL LOW (ref 6.5–8.1)

## 2019-08-02 LAB — CBC WITH DIFFERENTIAL/PLATELET
Abs Immature Granulocytes: 0.1 10*3/uL — ABNORMAL HIGH (ref 0.00–0.07)
Basophils Absolute: 0 10*3/uL (ref 0.0–0.1)
Basophils Relative: 0 %
Eosinophils Absolute: 0.1 10*3/uL (ref 0.0–0.5)
Eosinophils Relative: 1 %
HCT: 37.7 % (ref 36.0–46.0)
Hemoglobin: 12.3 g/dL (ref 12.0–15.0)
Immature Granulocytes: 1 %
Lymphocytes Relative: 11 %
Lymphs Abs: 1.3 10*3/uL (ref 0.7–4.0)
MCH: 31 pg (ref 26.0–34.0)
MCHC: 32.6 g/dL (ref 30.0–36.0)
MCV: 95 fL (ref 80.0–100.0)
Monocytes Absolute: 0.7 10*3/uL (ref 0.1–1.0)
Monocytes Relative: 6 %
Neutro Abs: 9.5 10*3/uL — ABNORMAL HIGH (ref 1.7–7.7)
Neutrophils Relative %: 81 %
Platelets: 178 10*3/uL (ref 150–400)
RBC: 3.97 MIL/uL (ref 3.87–5.11)
RDW: 12.6 % (ref 11.5–15.5)
WBC: 11.8 10*3/uL — ABNORMAL HIGH (ref 4.0–10.5)
nRBC: 0 % (ref 0.0–0.2)

## 2019-08-02 NOTE — MAU Provider Note (Signed)
History     CSN: [id number]  Arrival date and time: 08/02/19 1234   None     Chief Complaint  Patient presents with  . Abdominal Pain   Jacqueline Huerta is a  39 y.o. G3P2002 at [redacted]w[redacted]d who presents today with RUQ pain. She states that she has had this pain for apprx 1 month. She reports that she has been taking tylenol each night for about one month. She is also using a heating pad. She states that this helps. She denies any contractions, VB or LOF. She reports normal fetal movement.   Abdominal Pain This is a new problem. The current episode started 1 to 4 weeks ago. The problem occurs intermittently. The problem has been unchanged. The pain is located in the RUQ. The pain is moderate. The quality of the pain is aching. The abdominal pain radiates to the back. Nothing aggravates the pain. The pain is relieved by nothing. She has tried acetaminophen (heating pad ) for the symptoms. The treatment provided moderate relief.    OB History    Gravida  3   Para  2   Term  2   Preterm      AB      Living  2     SAB      TAB      Ectopic      Multiple      Live Births              Past Medical History:  Diagnosis Date  . Anxiety   . Breast tenderness in female   . Dysplastic nevus   . History of abnormal cervical Pap smear     Past Surgical History:  Procedure Laterality Date  . BREAST ENHANCEMENT SURGERY    . CESAREAN SECTION     x 2  . LASER ABLATION OF THE CERVIX  2009  . MOLE REMOVAL    . TUBAL LIGATION      Family History  Problem Relation Age of Onset  . Hyperlipidemia Mother   . Alcohol abuse Father   . Lung cancer Maternal Aunt   . Heart attack Maternal Grandfather   . Colon cancer Paternal Grandmother     Social History   Tobacco Use  . Smoking status: Never Smoker  . Smokeless tobacco: Never Used  Substance Use Topics  . Alcohol use: Yes    Alcohol/week: 1.0 standard drinks    Types: 1 Standard drinks or equivalent per week  .  Drug use: No    Allergies:  Allergies  Allergen Reactions  . Nitrofurantoin Nausea And Vomiting    Medications Prior to Admission  Medication Sig Dispense Refill Last Dose  . amoxicillin-clavulanate (AUGMENTIN) 875-125 MG tablet Take 1 tablet by mouth 2 (two) times daily. 6 tablet 0   . phenazopyridine (PYRIDIUM) 200 MG tablet Take 1 tablet (200 mg total) by mouth 3 (three) times daily as needed for pain. 20 tablet 0   . Prenatal Vit-Fe Fumarate-FA (PRENATAL VITAMIN PO) Take 1 capsule by mouth daily.       Review of Systems  Gastrointestinal: Positive for abdominal pain.  All other systems reviewed and are negative.  Physical Exam   Blood pressure 105/64, pulse 94, temperature 98.6 F (37 C), temperature source Oral, resp. rate 18, height 5\' 2"  (1.575 m), weight 71.1 kg, SpO2 100 %.  Physical Exam  Nursing note and vitals reviewed. Constitutional: She is oriented to person, place, and time. She appears well-developed and  well-nourished. No distress.  HENT:  Head: Normocephalic.  Cardiovascular: Normal rate.  Respiratory: Effort normal.  Neurological: She is alert and oriented to person, place, and time.  Skin: Skin is warm and dry.  Psychiatric: She has a normal mood and affect.   +FHT 160 with doppler   Results for orders placed or performed during the hospital encounter of 08/02/19 (from the past 24 hour(s))  Comprehensive metabolic panel     Status: Abnormal   Collection Time: 08/02/19  1:10 PM  Result Value Ref Range   Sodium 135 135 - 145 mmol/L   Potassium 3.6 3.5 - 5.1 mmol/L   Chloride 104 98 - 111 mmol/L   CO2 22 22 - 32 mmol/L   Glucose, Bld 79 70 - 99 mg/dL   BUN 6 6 - 20 mg/dL   Creatinine, Ser 0.54 0.44 - 1.00 mg/dL   Calcium 8.7 (L) 8.9 - 10.3 mg/dL   Total Protein 6.4 (L) 6.5 - 8.1 g/dL   Albumin 3.3 (L) 3.5 - 5.0 g/dL   AST 38 15 - 41 U/L   ALT 59 (H) 0 - 44 U/L   Alkaline Phosphatase 82 38 - 126 U/L   Total Bilirubin 0.4 0.3 - 1.2 mg/dL   GFR  calc non Af Amer >60 >60 mL/min   GFR calc Af Amer >60 >60 mL/min   Anion gap 9 5 - 15  CBC with Differential/Platelet     Status: Abnormal   Collection Time: 08/02/19  3:00 PM  Result Value Ref Range   WBC 11.8 (H) 4.0 - 10.5 K/uL   RBC 3.97 3.87 - 5.11 MIL/uL   Hemoglobin 12.3 12.0 - 15.0 g/dL   HCT 37.7 36.0 - 46.0 %   MCV 95.0 80.0 - 100.0 fL   MCH 31.0 26.0 - 34.0 pg   MCHC 32.6 30.0 - 36.0 g/dL   RDW 12.6 11.5 - 15.5 %   Platelets 178 150 - 400 K/uL   nRBC 0.0 0.0 - 0.2 %   Neutrophils Relative % 81 %   Neutro Abs 9.5 (H) 1.7 - 7.7 K/uL   Lymphocytes Relative 11 %   Lymphs Abs 1.3 0.7 - 4.0 K/uL   Monocytes Relative 6 %   Monocytes Absolute 0.7 0.1 - 1.0 K/uL   Eosinophils Relative 1 %   Eosinophils Absolute 0.1 0.0 - 0.5 K/uL   Basophils Relative 0 %   Basophils Absolute 0.0 0.0 - 0.1 K/uL   Immature Granulocytes 1 %   Abs Immature Granulocytes 0.10 (H) 0.00 - 0.07 K/uL   US ABDOMEN LIMITED RUQ  Result Date: 08/02/2019 CLINICAL DATA:  Upper abdominal pain EXAM: ULTRASOUND ABDOMEN LIMITED RIGHT UPPER QUADRANT COMPARISON:  None. FINDINGS: Gallbladder: No gallstones or wall thickening visualized. No pericholecystic fluid. No sonographic Murphy sign noted by sonographer. Common bile duct: Diameter: 2 mm. No intrahepatic or extrahepatic biliary duct dilatation. Liver: No focal lesion identified. Within normal limits in parenchymal echogenicity. Portal vein is patent on color Doppler imaging with normal direction of blood flow towards the liver. Other: Normal appearing pancreas. IMPRESSION: Study within normal limits. Electronically Signed   By: Lowella Grip III M.D.   On: 08/02/2019 14:32    MAU Course  Procedures  MDM 1620: DW Dr. Wilhelmenia Blase and reviewed results. She will follow up with the patient and do a GI referral and repeat liver enzymes.   Assessment and Plan   1. RUQ pain   2. Upper abdominal pain of unknown etiology  3. [redacted] weeks gestation of pregnancy   4.  Elevated alanine aminotransferase (ALT) level    DC home Comfort measures reviewed  2nd/3rd Trimester precautions  PTL precautions  Fetal kick counts RX: none  Return to MAU as needed FU with OB as planned  Follow-up Information    [facility] Follow up.   Contact information: [address]  [address] [place] [address] [phone]          Marcille Buffy DNP, CNM  08/02/19  5:13 PM

## 2019-08-02 NOTE — Discharge Instructions (Signed)

## 2019-08-02 NOTE — MAU Note (Signed)
Been having a lot of pain in RUQ and around to the back.  Dr wanted her to come over for an [place] and some blood work.  Has been going on for close to a month.pain is always there, worse as the day goes on. Denies nausea/vomiting. No baby complaints.

## 2019-08-03 LAB — BILE ACIDS, TOTAL: Bile Acids Total: <1 umol/L (ref 0.0–10.0)

## 2019-10-17 IMAGING — US US PELVIS COMPLETE TRANSABD/TRANSVAG W DUPLEX
1 series · 13 of 25 positions shown · non-contrast
Comparison: None.

CLINICAL DATA: Initial evaluation for intermittent left-sided
pelvic pain for 2 months. History of tubal ligation, ablation,
C-section.

EXAM:
TRANSABDOMINAL AND TRANSVAGINAL ULTRASOUND OF PELVIS
DOPPLER ULTRASOUND OF OVARIES
TECHNIQUE: Both transabdominal and transvaginal ultrasound examinations of the
pelvis were performed. Transabdominal technique was performed for
global imaging of the pelvis including uterus, ovaries, adnexal
regions, and pelvic cul-de-sac.
It was necessary to proceed with endovaginal exam following the
transabdominal exam to visualize the uterus, endometrium, and
ovaries. Color and duplex Doppler ultrasound was utilized to
evaluate blood flow to the ovaries.

[Series 1: us pelvis complete transabd/transvag w duplex · 0.18mm/px · 13 of 111 slices shown]
[im 1/111]
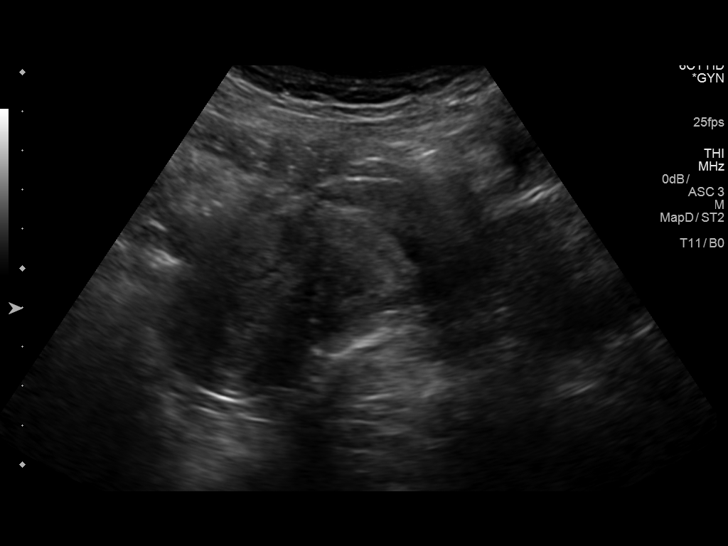
[im 10/111]
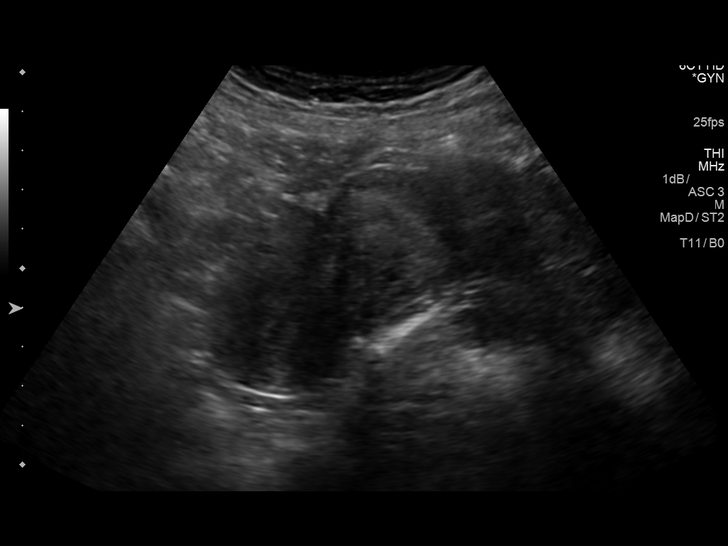
[im 19/111]
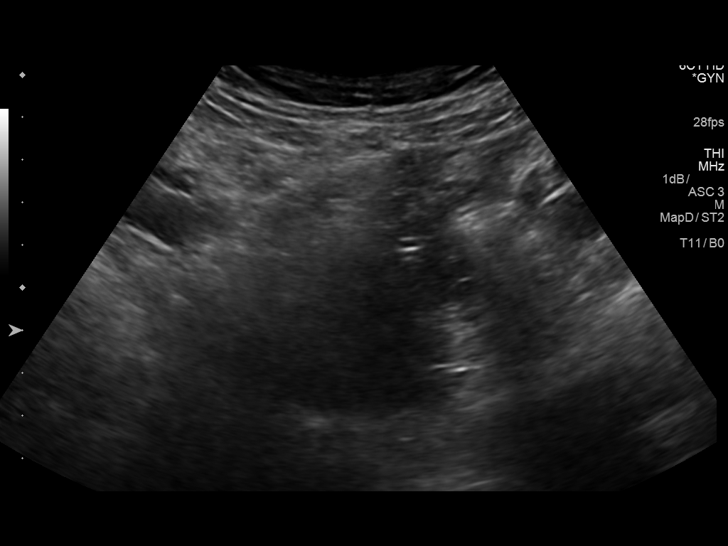
[im 28/111]
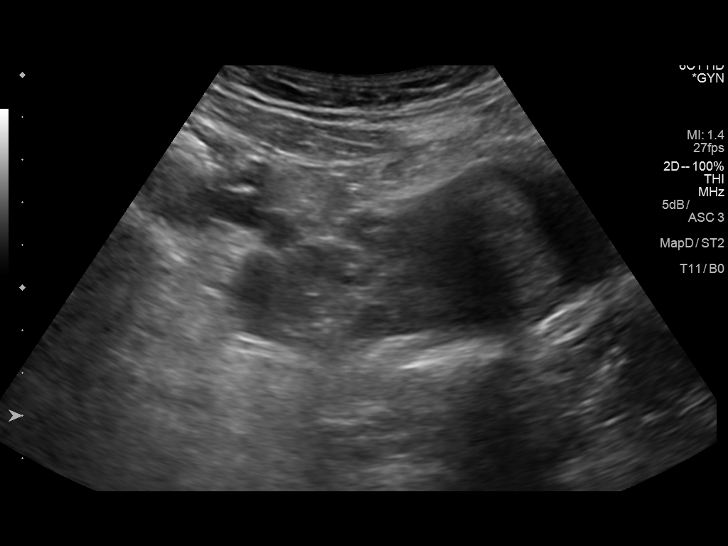
[im 37/111]
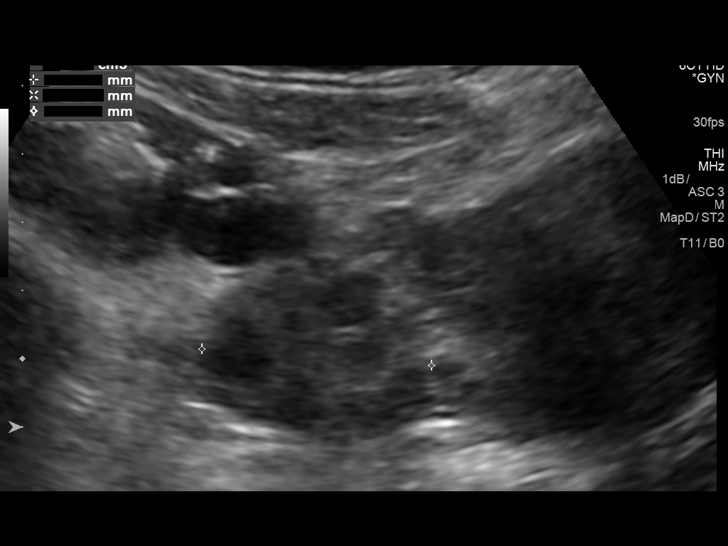
[im 46/111]
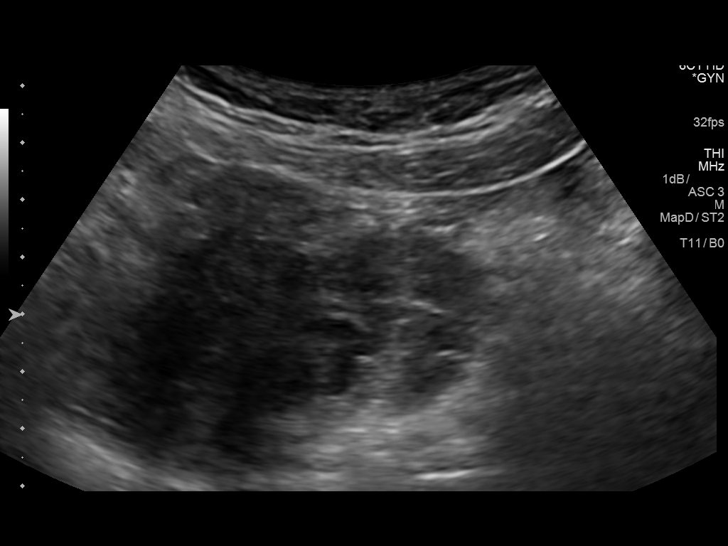
[im 56/111]
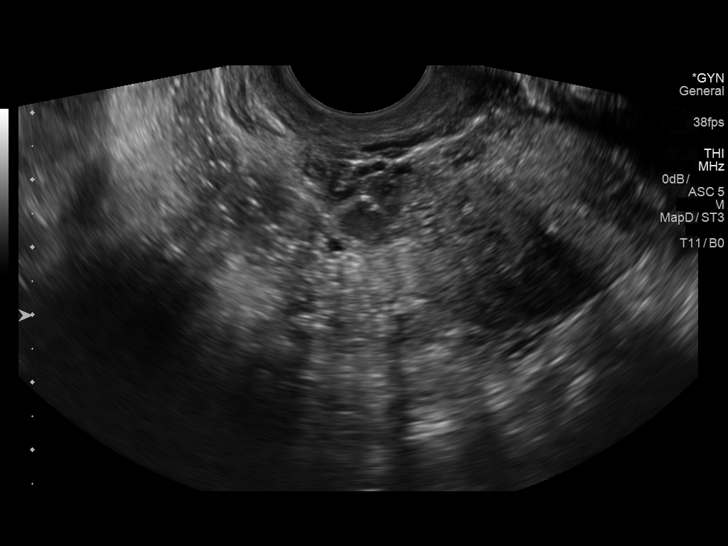
[im 65/111]
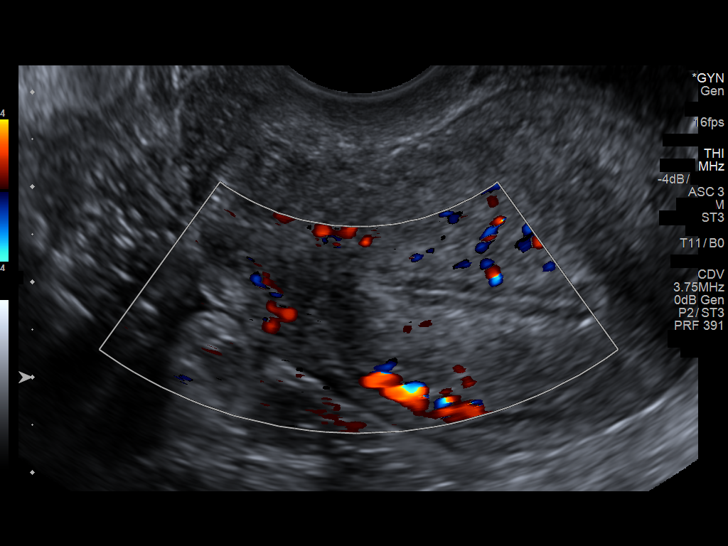
[im 74/111]
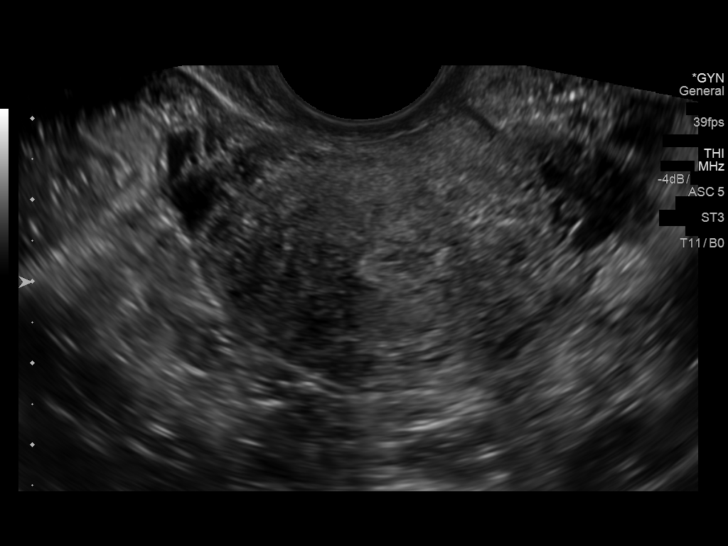
[im 83/111]
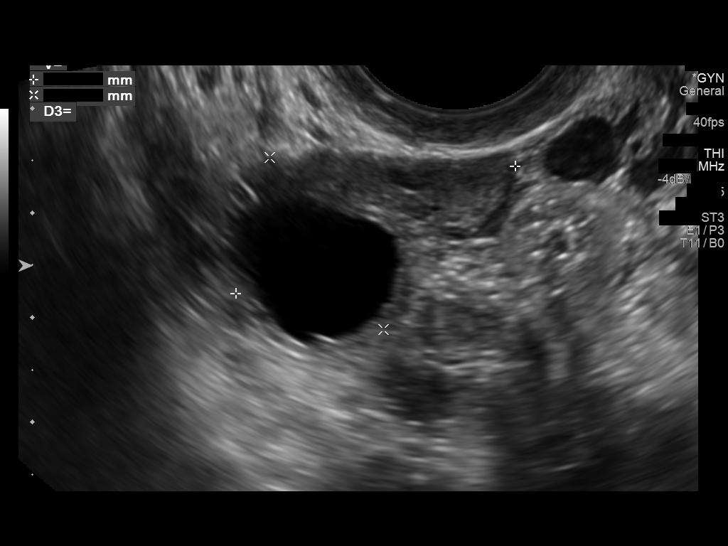
[im 92/111]
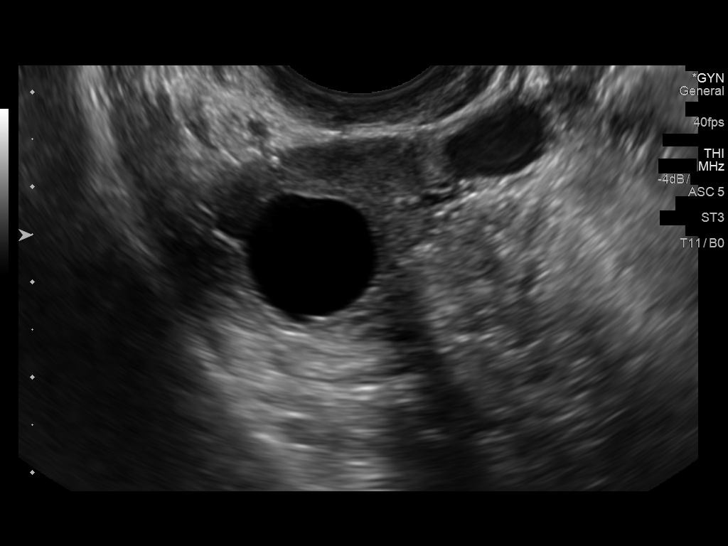
[im 101/111]
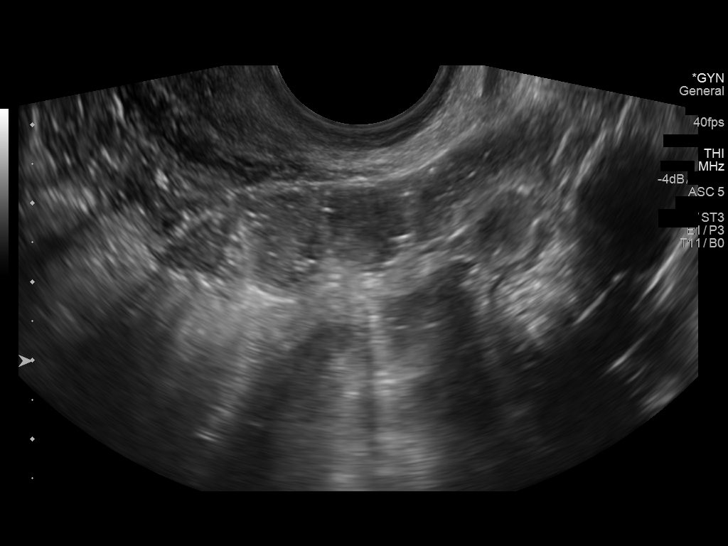
[im 111/111]
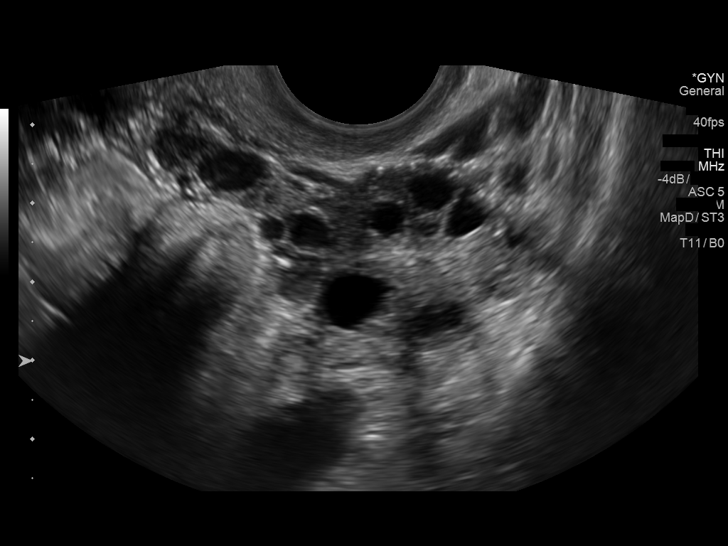

[13 of 25 positions shown; findings below may reference images not displayed]

FINDINGS: Uterus

Measurements: 6.4 x 3.9 x 4.8 cm = volume: 63 mL. No fibroids or
other mass visualized.

Endometrium

Thickness: 15 mm.  No focal abnormality visualized.

Right ovary

Measurements: 2.9 x 2.0 x 2.3 cm = volume: 7 mL. Normal
appearance/no adnexal mass. 1.4 cm dominant follicle noted.

Left ovary

Measurements: 3.4 x 1.6 x 2.6 cm = volume: 7 mL. Normal
appearance/no adnexal mass.

Pulsed Doppler evaluation of both ovaries demonstrates normal
low-resistance arterial and venous waveforms.

Other findings

No abnormal free fluid.

Prominent 6.1 mm vein noted within the left adnexa, raising the
possibility for pelvic congestion syndrome.
IMPRESSION: 1. Prominent vasculature within the left adnexa, raising the
possibility for pelvic congestion syndrome.
2. Otherwise unremarkable and normal pelvic ultrasound for age. No
evidence for ovarian torsion.

## 2019-11-04 NOTE — Patient Instructions (Signed)
Aarvi Stotts  11/04/2019   Your procedure is scheduled on:  11/18/2019  Arrive at 0800 at Entrance C on [address] [facility]  and [address]. You are invited to use the FREE valet parking or use the Visitor's parking deck.  Pick up the phone at the desk and dial [phone].  Call this number if you have problems the morning of surgery: [phone]  Remember:   Do not eat food:(After Midnight) Desps de medianoche.  Do not drink clear liquids: (After Midnight) Desps de medianoche.  Take these medicines the morning of surgery with A SIP OF WATER:  none   Do not wear jewelry, make-up or nail polish.  Do not wear lotions, powders, or perfumes. Do not wear deodorant.  Do not shave 48 hours prior to surgery.  Do not bring valuables to the hospital.  [facility] is not   responsible for any belongings or valuables brought to the hospital.  Contacts, dentures or bridgework may not be worn into surgery.  Leave suitcase in the car. After surgery it may be brought to your room.  For patients admitted to the hospital, checkout time is 11:00 AM the day of              discharge.      Please read over the following fact sheets that you were given:     Preparing for Surgery

## 2019-11-09 ENCOUNTER — Inpatient Hospital Stay (HOSPITAL_COMMUNITY): Payer: BC Managed Care – PPO | Admitting: Anesthesiology

## 2019-11-09 ENCOUNTER — Other Ambulatory Visit: Payer: Self-pay

## 2019-11-09 ENCOUNTER — Encounter (HOSPITAL_COMMUNITY): Payer: Self-pay | Admitting: Obstetrics and Gynecology

## 2019-11-09 ENCOUNTER — Encounter (HOSPITAL_COMMUNITY)
Admission: AD | Disposition: A | Discharge status: Home / Self Care | Payer: Self-pay | Source: Home / Self Care | Attending: Obstetrics and Gynecology

## 2019-11-09 ENCOUNTER — Inpatient Hospital Stay (HOSPITAL_COMMUNITY)
Admission: AD | Admit: 2019-11-09 | Discharge: 2019-11-12 | DRG: 785 | Disposition: A | Discharge status: Home / Self Care | Payer: BC Managed Care – PPO | Attending: Obstetrics and Gynecology | Admitting: Obstetrics and Gynecology

## 2019-11-09 DIAGNOSIS — O134 Gestational [pregnancy-induced] hypertension without significant proteinuria, complicating childbirth: Secondary | ICD-10-CM | POA: Diagnosis present

## 2019-11-09 DIAGNOSIS — Z20822 Contact with and (suspected) exposure to covid-19: Secondary | ICD-10-CM | POA: Diagnosis present

## 2019-11-09 DIAGNOSIS — Z302 Encounter for sterilization: Secondary | ICD-10-CM

## 2019-11-09 DIAGNOSIS — O34211 Maternal care for low transverse scar from previous cesarean delivery: Secondary | ICD-10-CM | POA: Diagnosis present

## 2019-11-09 DIAGNOSIS — Z98891 History of uterine scar from previous surgery: Secondary | ICD-10-CM

## 2019-11-09 DIAGNOSIS — Z3A37 37 weeks gestation of pregnancy: Secondary | ICD-10-CM

## 2019-11-09 DIAGNOSIS — R030 Elevated blood-pressure reading, without diagnosis of hypertension: Secondary | ICD-10-CM | POA: Diagnosis present

## 2019-11-09 HISTORY — DX: Essential (primary) hypertension: I10

## 2019-11-09 LAB — COMPREHENSIVE METABOLIC PANEL
ALT: 16 U/L (ref 0–44)
AST: 20 U/L (ref 15–41)
Albumin: 3 g/dL — ABNORMAL LOW (ref 3.5–5.0)
Alkaline Phosphatase: 409 U/L — ABNORMAL HIGH (ref 38–126)
Anion gap: 9 (ref 5–15)
BUN: 7 mg/dL (ref 6–20)
CO2: 22 mmol/L (ref 22–32)
Calcium: 8.8 mg/dL — ABNORMAL LOW (ref 8.9–10.3)
Chloride: 104 mmol/L (ref 98–111)
Creatinine, Ser: 0.72 mg/dL (ref 0.44–1.00)
GFR calc Af Amer: >60 mL/min (ref >60)
GFR calc non Af Amer: >60 mL/min (ref >60)
Glucose, Bld: 77 mg/dL (ref 70–99)
Potassium: 3.8 mmol/L (ref 3.5–5.1)
Sodium: 135 mmol/L (ref 135–145)
Total Bilirubin: 0.4 mg/dL (ref 0.3–1.2)
Total Protein: 6.1 g/dL — ABNORMAL LOW (ref 6.5–8.1)

## 2019-11-09 LAB — CBC
HCT: 37.6 % (ref 36.0–46.0)
Hemoglobin: 11.5 g/dL — ABNORMAL LOW (ref 12.0–15.0)
MCH: 25.9 pg — ABNORMAL LOW (ref 26.0–34.0)
MCHC: 30.6 g/dL (ref 30.0–36.0)
MCV: 84.7 fL (ref 80.0–100.0)
Platelets: 226 10*3/uL (ref 150–400)
RBC: 4.44 MIL/uL (ref 3.87–5.11)
RDW: 15 % (ref 11.5–15.5)
WBC: 12.5 10*3/uL — ABNORMAL HIGH (ref 4.0–10.5)
nRBC: 0.2 % (ref 0.0–0.2)

## 2019-11-09 LAB — ABO/RH: ABO/RH(D): O POS

## 2019-11-09 LAB — PROTEIN / CREATININE RATIO, URINE
Creatinine, Urine: 111.1 mg/dL
Protein Creatinine Ratio: 0.1 mg/mg{Cre} (ref 0.00–0.15)
Total Protein, Urine: 11 mg/dL

## 2019-11-09 LAB — SARS CORONAVIRUS 2 BY RT PCR (HOSPITAL ORDER, PERFORMED IN ~~LOC~~ HOSPITAL LAB): SARS Coronavirus 2: NEGATIVE

## 2019-11-09 SURGERY — Surgical Case
Anesthesia: Spinal

## 2019-11-09 MED ORDER — LACTATED RINGERS IV BOLUS: 1000.0000 mL | Freq: Once | INTRAVENOUS | Status: DC

## 2019-11-09 MED ORDER — OXYCODONE HCL 5 MG PO TABS: 5.0000 mg | ORAL_TABLET | Freq: Once | ORAL | Status: DC | PRN

## 2019-11-09 MED ORDER — SODIUM CHLORIDE 0.9% FLUSH: 3.0000 mL | INTRAVENOUS | Status: DC | PRN

## 2019-11-09 MED ORDER — NALOXONE HCL 4 MG/10ML IJ SOLN
1.0000 ug/kg/h | INTRAVENOUS | Status: DC | PRN
  Filled 2019-11-09: qty 5

## 2019-11-09 MED ORDER — KETOROLAC TROMETHAMINE 30 MG/ML IJ SOLN
30.0000 mg | Freq: Once | INTRAMUSCULAR | Status: AC | PRN
  Administered 2019-11-10: 30 mg via INTRAVENOUS

## 2019-11-09 MED ORDER — ONDANSETRON HCL 4 MG/2ML IJ SOLN
INTRAMUSCULAR | Status: DC | PRN
  Administered 2019-11-09: 4 mg via INTRAVENOUS

## 2019-11-09 MED ORDER — DEXAMETHASONE SODIUM PHOSPHATE 10 MG/ML IJ SOLN
INTRAMUSCULAR | Status: DC | PRN
  Administered 2019-11-09: 10 mg via INTRAVENOUS

## 2019-11-09 MED ORDER — OXYTOCIN-SODIUM CHLORIDE 30-0.9 UT/500ML-% IV SOLN
INTRAVENOUS | Status: DC | PRN
  Administered 2019-11-09: 30 [IU] via INTRAVENOUS

## 2019-11-09 MED ORDER — MORPHINE SULFATE (PF) 0.5 MG/ML IJ SOLN
INTRAMUSCULAR | Status: AC
  Filled 2019-11-09: qty 10

## 2019-11-09 MED ORDER — NALBUPHINE HCL 10 MG/ML IJ SOLN: 5.0000 mg | INTRAMUSCULAR | Status: DC | PRN

## 2019-11-09 MED ORDER — NALOXONE HCL 0.4 MG/ML IJ SOLN: 0.4000 mg | INTRAMUSCULAR | Status: DC | PRN

## 2019-11-09 MED ORDER — SCOPOLAMINE 1 MG/3DAYS TD PT72: 1.0000 | MEDICATED_PATCH | Freq: Once | TRANSDERMAL | Status: DC

## 2019-11-09 MED ORDER — DEXAMETHASONE SODIUM PHOSPHATE 10 MG/ML IJ SOLN
INTRAMUSCULAR | Status: AC
  Filled 2019-11-09: qty 1

## 2019-11-09 MED ORDER — LACTATED RINGERS IV SOLN: INTRAVENOUS | Status: DC

## 2019-11-09 MED ORDER — LACTATED RINGERS IV SOLN: INTRAVENOUS | Status: DC | PRN

## 2019-11-09 MED ORDER — PHENYLEPHRINE HCL-NACL 20-0.9 MG/250ML-% IV SOLN
INTRAVENOUS | Status: AC
  Filled 2019-11-09: qty 250

## 2019-11-09 MED ORDER — HYDROMORPHONE HCL 1 MG/ML IJ SOLN: 0.2500 mg | INTRAMUSCULAR | Status: DC | PRN

## 2019-11-09 MED ORDER — ONDANSETRON HCL 4 MG/2ML IJ SOLN
INTRAMUSCULAR | Status: AC
  Filled 2019-11-09: qty 2

## 2019-11-09 MED ORDER — NALBUPHINE HCL 10 MG/ML IJ SOLN: 5.0000 mg | Freq: Once | INTRAMUSCULAR | Status: DC | PRN

## 2019-11-09 MED ORDER — BUPIVACAINE IN DEXTROSE 0.75-8.25 % IT SOLN
INTRATHECAL | Status: DC | PRN
  Administered 2019-11-09: 12 mg via INTRATHECAL

## 2019-11-09 MED ORDER — ONDANSETRON HCL 4 MG/2ML IJ SOLN: 4.0000 mg | Freq: Once | INTRAMUSCULAR | Status: DC | PRN

## 2019-11-09 MED ORDER — MEPERIDINE HCL 25 MG/ML IJ SOLN
INTRAMUSCULAR | Status: DC | PRN
Start: 2019-11-09 — End: 2019-11-10
  Administered 2019-11-09: 12.5 mg via INTRAVENOUS

## 2019-11-09 MED ORDER — OXYTOCIN-SODIUM CHLORIDE 30-0.9 UT/500ML-% IV SOLN
INTRAVENOUS | Status: AC
  Filled 2019-11-09: qty 500

## 2019-11-09 MED ORDER — ONDANSETRON HCL 4 MG/2ML IJ SOLN: 4.0000 mg | Freq: Three times a day (TID) | INTRAMUSCULAR | Status: DC | PRN

## 2019-11-09 MED ORDER — FENTANYL CITRATE (PF) 100 MCG/2ML IJ SOLN
INTRAMUSCULAR | Status: DC | PRN
Start: 2019-11-09 — End: 2019-11-10
  Administered 2019-11-09: 15 ug via INTRATHECAL

## 2019-11-09 MED ORDER — FENTANYL CITRATE (PF) 100 MCG/2ML IJ SOLN
INTRAMUSCULAR | Status: AC
  Filled 2019-11-09: qty 2

## 2019-11-09 MED ORDER — DIPHENHYDRAMINE HCL 25 MG PO CAPS: 25.0000 mg | ORAL_CAPSULE | ORAL | Status: DC | PRN

## 2019-11-09 MED ORDER — CEFAZOLIN SODIUM-DEXTROSE 2-4 GM/100ML-% IV SOLN
2.0000 g | INTRAVENOUS | Status: AC
  Administered 2019-11-09: 2 g via INTRAVENOUS
  Filled 2019-11-09: qty 100

## 2019-11-09 MED ORDER — MEPERIDINE HCL 25 MG/ML IJ SOLN
INTRAMUSCULAR | Status: AC
  Filled 2019-11-09: qty 1

## 2019-11-09 MED ORDER — DIPHENHYDRAMINE HCL 50 MG/ML IJ SOLN: 12.5000 mg | INTRAMUSCULAR | Status: DC | PRN

## 2019-11-09 MED ORDER — MORPHINE SULFATE (PF) 0.5 MG/ML IJ SOLN
INTRAMUSCULAR | Status: DC | PRN
Start: 2019-11-09 — End: 2019-11-10
  Administered 2019-11-09: 150 ug via INTRATHECAL

## 2019-11-09 MED ORDER — OXYCODONE HCL 5 MG/5ML PO SOLN: 5.0000 mg | Freq: Once | ORAL | Status: DC | PRN

## 2019-11-09 MED ORDER — SOD CITRATE-CITRIC ACID 500-334 MG/5ML PO SOLN
30.0000 mL | Freq: Once | ORAL | Status: AC
  Administered 2019-11-09: 30 mL via ORAL
  Filled 2019-11-09: qty 30

## 2019-11-09 SURGICAL SUPPLY — 38 items
BENZOIN TINCTURE PRP APPL 2/3 (GAUZE/BANDAGES/DRESSINGS) ×3 IMPLANT
CLAMP CORD UMBIL (MISCELLANEOUS) IMPLANT
CLOSURE STERI STRIP 1/2 X4 (GAUZE/BANDAGES/DRESSINGS) ×3 IMPLANT
CLOTH BEACON ORANGE TIMEOUT ST (SAFETY) ×3 IMPLANT
DERMABOND ADVANCED (GAUZE/BANDAGES/DRESSINGS) ×4
DERMABOND ADVANCED .7 DNX12 (GAUZE/BANDAGES/DRESSINGS) ×2 IMPLANT
DRSG OPSITE POSTOP 4X10 (GAUZE/BANDAGES/DRESSINGS) ×3 IMPLANT
ELECT REM PT RETURN 9FT ADLT (ELECTROSURGICAL) ×3
ELECTRODE REM PT RTRN 9FT ADLT (ELECTROSURGICAL) ×1 IMPLANT
EXTRACTOR VACUUM KIWI (MISCELLANEOUS) IMPLANT
EXTRACTOR VACUUM M CUP 4 TUBE (SUCTIONS) IMPLANT
EXTRACTOR VACUUM M CUP 4' TUBE (SUCTIONS)
GLOVE BIO SURGEON STRL SZ8 (GLOVE) ×3 IMPLANT
GLOVE BIOGEL PI IND STRL 7.0 (GLOVE) ×1 IMPLANT
GLOVE BIOGEL PI INDICATOR 7.0 (GLOVE) ×2
GLOVE ORTHO TXT STRL SZ7.5 (GLOVE) ×3 IMPLANT
GOWN STRL REUS W/TWL LRG LVL3 (GOWN DISPOSABLE) ×6 IMPLANT
HEMOSTAT ARISTA ABSORB 3G PWDR (HEMOSTASIS) ×3 IMPLANT
KIT ABG SYR 3ML LUER SLIP (SYRINGE) IMPLANT
NEEDLE HYPO 25X5/8 SAFETYGLIDE (NEEDLE) ×3 IMPLANT
NS IRRIG 1000ML POUR BTL (IV SOLUTION) ×3 IMPLANT
PACK C SECTION WH (CUSTOM PROCEDURE TRAY) ×3 IMPLANT
PAD OB MATERNITY 4.3X12.25 (PERSONAL CARE ITEMS) ×3 IMPLANT
PENCIL SMOKE EVAC W/HOLSTER (ELECTROSURGICAL) ×3 IMPLANT
RTRCTR C-SECT PINK 25CM LRG (MISCELLANEOUS) ×3 IMPLANT
SUT CHROMIC 1 CTX 36 (SUTURE) ×6 IMPLANT
SUT PLAIN 0 NONE (SUTURE) IMPLANT
SUT PLAIN 2 0 (SUTURE) ×2
SUT PLAIN 2 0 XLH (SUTURE) IMPLANT
SUT PLAIN ABS 2-0 CT1 27XMFL (SUTURE) ×1 IMPLANT
SUT VIC AB 0 CT1 27 (SUTURE) ×4
SUT VIC AB 0 CT1 27XBRD ANBCTR (SUTURE) ×2 IMPLANT
SUT VIC AB 2-0 CT1 27 (SUTURE) ×6
SUT VIC AB 2-0 CT1 TAPERPNT 27 (SUTURE) ×3 IMPLANT
SUT VIC AB 4-0 KS 27 (SUTURE) IMPLANT
TOWEL OR 17X24 6PK STRL BLUE (TOWEL DISPOSABLE) ×3 IMPLANT
TRAY FOLEY W/BAG SLVR 14FR LF (SET/KITS/TRAYS/PACK) ×3 IMPLANT
WATER STERILE IRR 1000ML POUR (IV SOLUTION) ×6 IMPLANT

## 2019-11-09 NOTE — Anesthesia Preprocedure Evaluation (Signed)
Anesthesia Evaluation   Patient awake    Reviewed: Allergy & Precautions, Patient's Chart, lab work & pertinent test results  Airway Mallampati: II  TM Distance: >3 FB Neck ROM: Full    Dental no notable dental hx. (+) Teeth Intact, Dental Advisory Given   Pulmonary neg pulmonary ROS,    Pulmonary exam normal breath sounds clear to auscultation       Cardiovascular hypertension, negative cardio ROS Normal cardiovascular exam Rhythm:Regular Rate:Normal     Neuro/Psych negative neurological ROS     GI/Hepatic negative GI ROS, Neg liver ROS,   Endo/Other  negative endocrine ROS  Renal/GU negative Renal ROS     Musculoskeletal   Abdominal   Peds  Hematology Lab Results      Component                Value               Date                      WBC                      12.5 (H)            11/09/2019                HGB                      11.5 (L)            11/09/2019                HCT                      37.6                11/09/2019                MCV                      84.7                11/09/2019                PLT                      226                 11/09/2019            TXC pt w anti C   Anesthesia Other Findings   Reproductive/Obstetrics (+) Pregnancy                             Anesthesia Physical Anesthesia Plan  ASA: II  Anesthesia Plan: Spinal   Post-op Pain Management:    Induction:   PONV Risk Score and Plan: Treatment may vary due to age or medical condition  Airway Management Planned: Nasal Cannula and Natural Airway  Additional Equipment: None  Intra-op Plan:   Post-operative Plan:   Informed Consent: I have reviewed the patients History and Physical, chart, labs and discussed the procedure including the risks, benefits and alternatives for the proposed anesthesia with the patient or authorized representative who has indicated his/her understanding  and acceptance.     Dental advisory given  Plan Discussed with:   Anesthesia  Plan Comments: (37.5 wk G4P2 for rpt C/S awaiting Txc)        Anesthesia Quick Evaluation

## 2019-11-09 NOTE — Anesthesia Procedure Notes (Signed)
Spinal  Patient location during procedure: OB Start time: 11/09/2019 10:40 PM End time: 11/09/2019 10:45 PM Staffing Performed: anesthesiologist  Anesthesiologist: Barnet Glasgow, MD Preanesthetic Checklist Completed: patient identified, IV checked, risks and benefits discussed, surgical consent, monitors and equipment checked, pre-op evaluation and timeout performed Spinal Block Patient position: sitting Prep: DuraPrep and site prepped and draped Patient monitoring: heart rate, cardiac monitor, continuous pulse ox and blood pressure Approach: midline Location: L3-4 Injection technique: single-shot Needle Needle type: Pencan  Needle gauge: 24 G Needle length: 10 cm Needle insertion depth: 7 cm Assessment Sensory level: T4 Additional Notes  1 Attempt (s). Pt tolerated procedure well.

## 2019-11-09 NOTE — MAU Provider Note (Addendum)
Chief Complaint:  Hypertension and Contractions   First Provider Initiated Contact with Patient 11/09/19 1844     HPI: Jacqueline Huerta is a 39 y.o. [id number] at [redacted]w[redacted]d who presents to maternity admissions reporting hypertension & contractions. Was seen in the office this afternoon & had elevated BPs. Denies history of hypertension. Denies headache, visual disturbance, or epigastric pain. Good fetal movement.  Reports some contractions since her cervix was checked in the office.   Location: abdomen Quality: contractions Severity: 7/10 in pain scale Duration: 4 hours Timing: intermittent Modifying factors: none Associated signs and symptoms: none  Pregnancy Course: [facility] ob/gyn. C/s x 2.   Past Medical History:  Diagnosis Date   Anxiety    Breast tenderness in female    Dysplastic nevus    History of abnormal cervical Pap smear    Hypertension    OB History  Gravida Para Term Preterm AB Living  4 2 2   1 2   SAB TAB Ectopic Multiple Live Births  1       1    # Outcome Date GA Lbr Len/2nd Weight Sex Delivery Anes PTL Lv  4 Current           3 SAB 11/2018          2 Term 08/24/09    F CS-LTranv     1 Term 03/25/07    M CS-LTranv   LIV   Past Surgical History:  Procedure Laterality Date   BREAST ENHANCEMENT SURGERY     CESAREAN SECTION     x 2   LASER ABLATION OF THE CERVIX  2009   MOLE REMOVAL     TUBAL LIGATION     Family History  Problem Relation Age of Onset   Hyperlipidemia Mother    Alcohol abuse Father    Lung cancer Maternal Aunt    Heart attack Maternal Grandfather    Colon cancer Paternal Grandmother    Social History   Tobacco Use   Smoking status: Never Smoker   Smokeless tobacco: Never Used  Vaping Use   Vaping Use: Never used  Substance Use Topics   Alcohol use: Yes    Alcohol/week: 1.0 standard drink    Types: 1 Standard drinks or equivalent per week   Drug use: No   Allergies  Allergen Reactions   Nitrofurantoin Nausea And Vomiting    Medications Prior to Admission  Medication Sig Dispense Refill Last Dose   aspirin 81 MG chewable tablet Chew by mouth daily.   11/08/2019 at Unknown time   pseudoephedrine-acetaminophen (TYLENOL SINUS) 30-500 MG TABS tablet Take 1 tablet by mouth every 4 (four) hours as needed.      hydrOXYzine (ATARAX/VISTARIL) 25 MG tablet Take 25 mg by mouth 3 (three) times daily as needed.      Prenatal Vit-Fe Fumarate-FA (PRENATAL VITAMIN PO) Take 1 capsule by mouth daily.       I have reviewed patient's Past Medical Hx, Surgical Hx, Family Hx, Social Hx, medications and allergies.   ROS:  Review of Systems  Constitutional: Negative.   Eyes: Negative for visual disturbance.  Gastrointestinal: Positive for abdominal pain. Negative for diarrhea, nausea and vomiting.  Genitourinary: Negative.   Neurological: Negative for headaches.    Physical Exam   Patient Vitals for the past 24 hrs:  BP Temp Temp src Pulse Resp SpO2  11/09/19 1922 -- -- -- -- -- 100 %  11/09/19 1920 -- -- -- -- -- 100 %  11/09/19 1915 123/79 -- -- Marland Kitchen  117 -- 100 %  11/09/19 1910 -- -- -- -- -- 100 %  11/09/19 1905 -- -- -- -- -- 100 %  11/09/19 1900 113/81 -- -- (!) 120 -- 100 %  11/09/19 1855 -- -- -- -- -- 100 %  11/09/19 1850 -- -- -- -- -- 100 %  11/09/19 1845 117/79 -- -- (!) 110 -- 100 %  11/09/19 1842 122/73 -- -- (!) 112 -- --  11/09/19 1840 -- -- -- -- -- 100 %  11/09/19 1835 -- -- -- -- -- 100 %  11/09/19 1830 -- -- -- -- -- 100 %  11/09/19 1826 130/85 97.9 F (36.6 C) Oral (!) 117 18 --  11/09/19 1825 -- -- -- -- -- 100 %  11/09/19 1820 -- -- -- -- -- 100 %  11/09/19 1815 -- -- -- -- -- 100 %  11/09/19 1810 -- -- -- -- -- 100 %  11/09/19 1805 -- -- -- -- -- 100 %  11/09/19 1804 126/75 -- -- (!) 118 -- --    Constitutional: Well-developed, well-nourished female in no acute distress.  Cardiovascular: normal rate & rhythm, no murmur Respiratory: normal effort, lung sounds clear throughout GI: Abd  soft, non-tender, gravid appropriate for gestational age. Pos BS x 4 MS: Extremities nontender, no edema, normal ROM Neurologic: Alert and oriented x 4.  GU:  Dilation: 1 Effacement (%): 20 Cervical Position: Posterior Exam by:: TLYTLE RN  Fetal Tracing:  Baseline: 155 Variability: moderate Accelerations: 15x15 Decelerations: none  Toco: Q2-6 minutes    Labs: Results for orders placed or performed during the hospital encounter of 11/09/19 (from the past 24 hour(s))  CBC     Status: Abnormal   Collection Time: 11/09/19  5:47 PM  Result Value Ref Range   WBC 12.5 (H) 4.0 - 10.5 K/uL   RBC 4.44 3.87 - 5.11 MIL/uL   Hemoglobin 11.5 (L) 12.0 - 15.0 g/dL   HCT 37.6 36 - 46 %   MCV 84.7 80.0 - 100.0 fL   MCH 25.9 (L) 26.0 - 34.0 pg   MCHC 30.6 30.0 - 36.0 g/dL   RDW 15.0 11.5 - 15.5 %   Platelets 226 150 - 400 K/uL   nRBC 0.2 0.0 - 0.2 %  Comprehensive metabolic panel     Status: Abnormal   Collection Time: 11/09/19  5:47 PM  Result Value Ref Range   Sodium 135 135 - 145 mmol/L   Potassium 3.8 3.5 - 5.1 mmol/L   Chloride 104 98 - 111 mmol/L   CO2 22 22 - 32 mmol/L   Glucose, Bld 77 70 - 99 mg/dL   BUN 7 6 - 20 mg/dL   Creatinine, Ser 0.72 0.44 - 1.00 mg/dL   Calcium 8.8 (L) 8.9 - 10.3 mg/dL   Total Protein 6.1 (L) 6.5 - 8.1 g/dL   Albumin 3.0 (L) 3.5 - 5.0 g/dL   AST 20 15 - 41 U/L   ALT 16 0 - 44 U/L   Alkaline Phosphatase 409 (H) 38 - 126 U/L   Total Bilirubin 0.4 0.3 - 1.2 mg/dL   GFR calc non Af Amer >60 >60 mL/min   GFR calc Af Amer >60 >60 mL/min   Anion gap 9 5 - 15  Type and screen     Status: None   Collection Time: 11/09/19  5:47 PM  Result Value Ref Range   ABO/RH(D) O POS    Antibody Screen POS    Sample Expiration 11/12/2019,2359  Antibody Identification      ANTI c Performed at [facility], [place] [address], [place], [place] [address]   SARS Coronavirus 2 by RT PCR (hospital order, performed in [facility] hospital lab) Nasopharyngeal  Nasopharyngeal Swab     Status: None   Collection Time: 11/09/19  6:06 PM   Specimen: Nasopharyngeal Swab  Result Value Ref Range   SARS Coronavirus 2 NEGATIVE NEGATIVE  Protein / creatinine ratio, urine     Status: None   Collection Time: 11/09/19  6:11 PM  Result Value Ref Range   Creatinine, Urine 111.10 mg/dL   Total Protein, Urine 11 mg/dL   Protein Creatinine Ratio 0.10 0.00 - 0.15 mg/mg[Cre]    Imaging:  No results found.  MAU Course: Orders Placed This Encounter  Procedures   SARS Coronavirus 2 by RT PCR (hospital order, performed in [facility] hospital lab) Nasopharyngeal Nasopharyngeal Swab   CBC   Comprehensive metabolic panel   Protein / creatinine ratio, urine   RPR   Diet NPO time specified   Type and screen   Meds ordered this encounter  Medications   lactated ringers bolus 1,000 mL    MDM: Patient normotensive while in MAU. Preeclampsia labs normal.   Complains of worsening contractions while in MAU. Cervix checked x 2 without change from today in the office. Offered IV fluid bolus & recheck; pt agrees. Dr. Ricardo Jericho aware of contractions. If cervix remains unchanged can be discharged home. Will need BP check scheduled in the office on Monday.   Care turned over to [facility]  Jorje Guild, NP 11/09/2019 8:53 PM  9:39 PM Dr. Wilhelmenia Blase here. She has rechecked the patient and she is now 2cm. Plan to take her to the OR.   Care assumed by Dr. Wilhelmenia Blase.   Marcille Buffy DNP, CNM  11/09/19  9:39 PM

## 2019-11-09 NOTE — MAU Note (Signed)
Pt sent from office for BP eval. Had elevated Bps, states she has also had swelling in feet, hands, face. Denies headache, visual changes, endorses RUQ pain.  Feeling cntx in MAU, denies LOF, has seen some brown bleeding. +FM

## 2019-11-09 NOTE — MAU Note (Signed)
Anesthesia, OR, AC informed of C-section decision.

## 2019-11-09 NOTE — H&P (Signed)
Jacqueline Huerta is a 39 y.o. female who initially presented to MAU for evaluation of elevated Bps 140s/90s in office and new-onset LE edema. Normotensive with normal labs in MAU however noticed irr ctx which became stronger. +FM, denies VB, LOF  PNC s/f h/o csx x2, AMA (normal panorama), h/o IUGR/SGA w/ abnl dopplers (on baby ASA this preg). Desires BTL with this pregnancy OB History    Gravida  4   Para  2   Term  2   Preterm      AB  1   Living  2     SAB  1   TAB      Ectopic      Multiple      Live Births  1          Past Medical History:  Diagnosis Date  . Anxiety   . Breast tenderness in female   . Dysplastic nevus   . History of abnormal cervical Pap smear   . Hypertension    Past Surgical History:  Procedure Laterality Date  . BREAST ENHANCEMENT SURGERY    . CESAREAN SECTION     x 2  . LASER ABLATION OF THE CERVIX  2009  . MOLE REMOVAL    . TUBAL LIGATION     Family History: family history includes Alcohol abuse in her father; Colon cancer in her paternal grandmother; Heart attack in her maternal grandfather; Hyperlipidemia in her mother; Lung cancer in her maternal aunt. Social History:  reports that she has never smoked. She has never used smokeless tobacco. She reports current alcohol use of about 1.0 standard drink of alcohol per week. She reports that she does not use drugs.     Maternal Diabetes: No 1hr 125 Genetic Screening: Normal Maternal Ultrasounds/Referrals: Normal  GS 2935g/45%tile on 9/8 Fetal Ultrasounds or other Referrals:  None Maternal Substance Abuse:  No Significant Maternal Medications:  None Significant Maternal Lab Results:  Group B Strep negative Other Comments:  None  Review of Systems  Constitutional: Negative for chills and fever.  Respiratory: Negative for shortness of breath.   Cardiovascular: Negative for chest pain, palpitations and leg swelling.  Gastrointestinal: Negative for abdominal pain and vomiting.   Neurological: Negative for dizziness, weakness and headaches.  Psychiatric/Behavioral: Negative for suicidal ideas.   History Dilation: 1 Effacement (%): 20 Exam by:: TLYTLE RN Blood pressure 123/79, pulse (!) 117, temperature 97.9 F (36.6 C), temperature source Oral, resp. rate 18, SpO2 100 %. Exam Physical Exam Constitutional:      General: She is not in acute distress.    Appearance: She is well-developed.  HENT:     Head: Normocephalic and atraumatic.  Eyes:     Pupils: Pupils are equal, round, and reactive to light.  Cardiovascular:     Rate and Rhythm: Normal rate and regular rhythm.     Heart sounds: No murmur heard.  No gallop.   Abdominal:     Tenderness: There is no abdominal tenderness. There is no guarding or rebound.  Genitourinary:    Vagina: Normal.  Musculoskeletal:        General: Normal range of motion.     Cervical back: Normal range of motion and neck supple.  Skin:    General: Skin is warm and dry.  Neurological:     Mental Status: She is alert and oriented to person, place, and time.     Prenatal labs: ABO, Rh: --/--/O POS (09/15 1747) Antibody: POS (09/15 1747) Rubella: Immune (  03/03 0000) RPR: Nonreactive (03/03 0000)  HBsAg: Negative (03/03 0000)  HIV: Non-reactive (03/03 0000)  GBS:   neg  Category 1 tracing Toco initially irr, then transition to q27m, palpate strongly. No improvement with IVF  Assessment/Plan: This is a 39yo [id number] @ 11 5/7 by CFI 8 2/7 scan presents in early labor. Initially triaged for r/o GHTN, that workup WNL. Found to make cervical change from ftp-1cm in office to 2/90/-2 tonight despite IV fluid bolus. Has been NPO since noon. H/o csx x2. S/p tubal reanastamoses for this pregnancy and desires bilateral salpingectomy with repeat section.   R/B/A of cesarean section discussed with patient. Alternative would be vaginal delivery which would mean shorter postpartum stay and decreased risk of bleeding. Risks of section  include infection of the uterus, pelvic organs, or skin, inadvertent injury to internal organs, such as bowel or bladder. If there is major injury, extensive surgery may be required. If injury is minor, it may be treated with relative ease. Discussed possibility of excessive blood loss and transfusion. If bleeding cannot be controlled using medical or minor surgical methods, a cesarean hysterectomy may be performed which would mean no future fertility. Patient accepts the possibility of blood transfusion, if necessary. Patient understands and agrees to move forward with section. Dicussed with patient R/B/A of bilateral salpingectomy. Alternatives includes internal ligation, LARCs. Discussed increased risk of ectopic pregnancy after procedure (1 in 3 pregnancies post-ligation can be ectopic) and that a pregnancy test should be taken if suspicion arises postpartum. Discussed risk of failure 1/100, backup method should be used for 2-4wks. Discussed R/B or surgical procedure including bleeding, infection, injury to surrounding organs. Patient agrees to blood products if needed. Discussed that BTL does not protect against STI and that barrier protection should be used. Patient understands and intends to comply.   NPO since noon. labwork completed, COVID screening negative. Will notify OR and anesthesia.   Jacqueline Huerta Jacqueline Huerta 11/09/2019, 9:51 PM

## 2019-11-10 ENCOUNTER — Encounter (HOSPITAL_COMMUNITY): Payer: Self-pay | Admitting: Obstetrics and Gynecology

## 2019-11-10 DIAGNOSIS — Z98891 History of uterine scar from previous surgery: Secondary | ICD-10-CM

## 2019-11-10 LAB — CBC
HCT: 34.8 % — ABNORMAL LOW (ref 36.0–46.0)
Hemoglobin: 10.5 g/dL — ABNORMAL LOW (ref 12.0–15.0)
MCH: 25.4 pg — ABNORMAL LOW (ref 26.0–34.0)
MCHC: 30.2 g/dL (ref 30.0–36.0)
MCV: 84.1 fL (ref 80.0–100.0)
Platelets: 210 10*3/uL (ref 150–400)
RBC: 4.14 MIL/uL (ref 3.87–5.11)
RDW: 15 % (ref 11.5–15.5)
WBC: 16 10*3/uL — ABNORMAL HIGH (ref 4.0–10.5)
nRBC: 0 % (ref 0.0–0.2)

## 2019-11-10 LAB — RPR: RPR Ser Ql: NONREACTIVE

## 2019-11-10 MED ORDER — KETOROLAC TROMETHAMINE 30 MG/ML IJ SOLN
30.0000 mg | Freq: Four times a day (QID) | INTRAMUSCULAR | Status: AC
  Administered 2019-11-10 (×4): 30 mg via INTRAVENOUS
  Filled 2019-11-10 (×4): qty 1

## 2019-11-10 MED ORDER — SIMETHICONE 80 MG PO CHEW
80.0000 mg | CHEWABLE_TABLET | ORAL | Status: DC | PRN
  Administered 2019-11-11: 80 mg via ORAL

## 2019-11-10 MED ORDER — COCONUT OIL OIL
1.0000 "application " | TOPICAL_OIL | Status: DC | PRN
  Administered 2019-11-10: 1 via TOPICAL

## 2019-11-10 MED ORDER — MENTHOL 3 MG MT LOZG: 1.0000 | LOZENGE | OROMUCOSAL | Status: DC | PRN

## 2019-11-10 MED ORDER — SENNOSIDES-DOCUSATE SODIUM 8.6-50 MG PO TABS
2.0000 | ORAL_TABLET | ORAL | Status: DC
  Administered 2019-11-10 – 2019-11-12 (×2): 2 via ORAL
  Filled 2019-11-10 (×2): qty 2

## 2019-11-10 MED ORDER — ACETAMINOPHEN 500 MG PO TABS
1000.0000 mg | ORAL_TABLET | Freq: Four times a day (QID) | ORAL | Status: DC
  Administered 2019-11-10 – 2019-11-12 (×10): 1000 mg via ORAL
  Filled 2019-11-10 (×10): qty 2

## 2019-11-10 MED ORDER — LACTATED RINGERS IV SOLN: INTRAVENOUS | Status: DC

## 2019-11-10 MED ORDER — PANTOPRAZOLE SODIUM 40 MG PO TBEC
40.0000 mg | DELAYED_RELEASE_TABLET | Freq: Every day | ORAL | Status: DC
  Administered 2019-11-10 – 2019-11-12 (×3): 40 mg via ORAL
  Filled 2019-11-10 (×3): qty 1

## 2019-11-10 MED ORDER — PRENATAL MULTIVITAMIN CH
1.0000 | ORAL_TABLET | Freq: Every day | ORAL | Status: DC
  Administered 2019-11-10 – 2019-11-12 (×3): 1 via ORAL
  Filled 2019-11-10 (×3): qty 1

## 2019-11-10 MED ORDER — SODIUM CHLORIDE 0.9 % IR SOLN
Status: DC | PRN
  Administered 2019-11-09: 1

## 2019-11-10 MED ORDER — DIPHENHYDRAMINE HCL 25 MG PO CAPS: 25.0000 mg | ORAL_CAPSULE | Freq: Four times a day (QID) | ORAL | Status: DC | PRN

## 2019-11-10 MED ORDER — WITCH HAZEL-GLYCERIN EX PADS: 1.0000 "application " | MEDICATED_PAD | CUTANEOUS | Status: DC | PRN

## 2019-11-10 MED ORDER — DIBUCAINE (PERIANAL) 1 % EX OINT: 1.0000 "application " | TOPICAL_OINTMENT | CUTANEOUS | Status: DC | PRN

## 2019-11-10 MED ORDER — SIMETHICONE 80 MG PO CHEW
80.0000 mg | CHEWABLE_TABLET | Freq: Three times a day (TID) | ORAL | Status: DC
  Administered 2019-11-10 – 2019-11-12 (×7): 80 mg via ORAL
  Filled 2019-11-10 (×6): qty 1

## 2019-11-10 MED ORDER — ZOLPIDEM TARTRATE 5 MG PO TABS: 5.0000 mg | ORAL_TABLET | Freq: Every evening | ORAL | Status: DC | PRN

## 2019-11-10 MED ORDER — CEFAZOLIN SODIUM-DEXTROSE 2-4 GM/100ML-% IV SOLN
2.0000 g | INTRAVENOUS | Status: DC
  Filled 2019-11-10: qty 100

## 2019-11-10 MED ORDER — PHENYLEPHRINE HCL-NACL 20-0.9 MG/250ML-% IV SOLN
INTRAVENOUS | Status: DC | PRN
  Administered 2019-11-09: 50 ug/min via INTRAVENOUS

## 2019-11-10 MED ORDER — TETANUS-DIPHTH-ACELL PERTUSSIS 5-2.5-18.5 LF-MCG/0.5 IM SUSP: 0.5000 mL | Freq: Once | INTRAMUSCULAR | Status: DC

## 2019-11-10 MED ORDER — SIMETHICONE 80 MG PO CHEW
80.0000 mg | CHEWABLE_TABLET | ORAL | Status: DC
  Administered 2019-11-10: 80 mg via ORAL
  Filled 2019-11-10 (×2): qty 1

## 2019-11-10 MED ORDER — POVIDONE-IODINE 10 % EX SWAB: 2.0000 "application " | Freq: Once | CUTANEOUS | Status: DC

## 2019-11-10 MED ORDER — IBUPROFEN 800 MG PO TABS
800.0000 mg | ORAL_TABLET | Freq: Four times a day (QID) | ORAL | Status: DC
  Administered 2019-11-11 – 2019-11-12 (×6): 800 mg via ORAL
  Filled 2019-11-10 (×6): qty 1

## 2019-11-10 MED ORDER — SOD CITRATE-CITRIC ACID 500-334 MG/5ML PO SOLN
30.0000 mL | Freq: Once | ORAL | Status: AC
  Administered 2019-11-10: 30 mL via ORAL
  Filled 2019-11-10: qty 30

## 2019-11-10 MED ORDER — OXYTOCIN-SODIUM CHLORIDE 30-0.9 UT/500ML-% IV SOLN: 2.5000 [IU]/h | INTRAVENOUS | Status: AC

## 2019-11-10 MED ORDER — OXYCODONE HCL 5 MG PO TABS
5.0000 mg | ORAL_TABLET | ORAL | Status: DC | PRN
  Administered 2019-11-12 (×3): 5 mg via ORAL
  Filled 2019-11-10 (×4): qty 1

## 2019-11-10 MED ORDER — KETOROLAC TROMETHAMINE 30 MG/ML IJ SOLN
INTRAMUSCULAR | Status: AC
  Filled 2019-11-10: qty 1

## 2019-11-10 MED ORDER — SCOPOLAMINE 1 MG/3DAYS TD PT72
MEDICATED_PATCH | TRANSDERMAL | Status: AC
  Filled 2019-11-10: qty 1

## 2019-11-10 NOTE — Progress Notes (Signed)
MOB was referred for history of depression/anxiety. * Referral screened out by Clinical Social Worker because none of the following criteria appear to apply: ~ History of anxiety/depression during this pregnancy, or of post-partum depression. ~ Diagnosis of anxiety and/or depression within last 3 years OR * MOB's symptoms currently being treated with medication and/or therapy. MOB has an active Rx for Vistaril.  No concerns were noted in [facility] record.   Please contact the Clinical Social Worker if needs arise, or if MOB requests.  Laurey Arrow, MSW, LCSW Clinical Social Work [phone]

## 2019-11-10 NOTE — Anesthesia Postprocedure Evaluation (Signed)
Anesthesia Post Note  Patient: Jacqueline Huerta  Procedure(s) Performed: CESAREAN SECTION WITH BILATERAL TUBAL LIGATION (N/A )     Patient location during evaluation: Mother Baby Anesthesia Type: Spinal Level of consciousness: oriented and awake and alert Pain management: pain level controlled Vital Signs Assessment: post-procedure vital signs reviewed and stable Respiratory status: spontaneous breathing and respiratory function stable Cardiovascular status: blood pressure returned to baseline and stable Postop Assessment: no headache, no backache, no apparent nausea or vomiting and able to ambulate Anesthetic complications: no   No complications documented.  Last Vitals:  Vitals:   11/09/19 2232 11/10/19 0031  BP:  116/74  Pulse:  99  Resp:  15  Temp:  36.9 C  SpO2: 100% 99%    Last Pain:  Vitals:   11/10/19 0031  TempSrc: Oral  PainSc: 0-No pain   Pain Goal:    LLE Motor Response: Purposeful movement (11/10/19 0031) LLE Sensation: Tingling (11/10/19 0031) RLE Motor Response: Purposeful movement (11/10/19 0031) RLE Sensation: Full sensation (11/10/19 0031)     Epidural/Spinal Function Cutaneous sensation: Able to Discern Pressure (11/10/19 0031), Patient able to flex knees: Yes (11/10/19 0031), Patient able to lift hips off bed: No (11/10/19 0031), Back pain beyond tenderness at insertion site: No (11/10/19 0031), Progressively worsening motor and/or sensory loss: No (11/10/19 0031), Bowel and/or bladder incontinence post epidural: No (11/10/19 0031)  Barnet Glasgow

## 2019-11-10 NOTE — Lactation Note (Signed)
This note was copied from a baby's chart. Lactation Consultation Note  Patient Name: Jacqueline Huerta [id number] Date: 11/10/2019 Reason for consult: Initial assessment  Initial visit with 43 hours old infant. Infant is latched to left breast, cradle hold upon arrival. Mother states breastfeeding is going well but she feels baby is not getting enough. Mother explains infant wants to be at the breast at all times. Noticed sub-optimal position and shallow latch. Nipple looks slanted and noted compression stripe. Offered assistance, mother verbalized agreement. Assisted with pillows for football position. Reviewed importance of a deep latch for nipple comfort and effective milk transfer. Hand expressed some colostrum and demonstrated for mother, colostrum easily expressed. Deep latch achieved, active suckling, breast tissue movement and audible swallowing noted. Mother denied nipple discomfort or pain. Reviewed back and neck support to avoid Infant slipping off breast. Infant unlatched after actively breastfeeding for ~20 minutes. Encouraged mother to burp. Mother stated wanting to latch to left breast. Repositioned pillows for foot ball position to right breast and assisted with latch. Infant active suckling for ~82minutes and then fell asleep. Noted round nipple. Mother placed infant skin to skin.   Reviewed with mother average size of a NB stomach. Encourage to follow infants' hunger and fullness cues. Reviewed importance to offer the breast 8 to 12 times in a 24-hour period for proper stimulation and to establish good milk supply. Reviewed colostrum benefits for infant. Reviewed breastfeeding basics. Reviewed signs of good milk transfer. Discussed milk coming to volume. Promoted maternal rest, hydration and food intake. Reviewed newborn behavior and expectations with mother and encouraged to contact [facility] for support, questions or concerns.   Provided [facility] handout.  All questions answered at this  time.    Maternal Data Formula Feeding for Exclusion: No Has patient been taught Hand Expression?: Yes Does the patient have breastfeeding experience prior to this delivery?: Yes  Feeding Feeding Type: Breast Fed  LATCH Score Latch: Grasps breast easily, tongue down, lips flanged, rhythmical sucking.  Audible Swallowing: Spontaneous and intermittent  Type of Nipple: Everted at rest and after stimulation  Comfort (Breast/Nipple): Filling, red/small blisters or bruises, mild/mod discomfort (COmpression stripe -left nipple)  Hold (Positioning): Assistance needed to correctly position infant at breast and maintain latch.  LATCH Score: 8  Interventions Interventions: Breast feeding basics reviewed;Assisted with latch;Skin to skin;Breast massage;Hand express;Adjust position;Support pillows;Position options;Coconut oil  Lactation Tools Discussed/Used WIC Program: No   Consult Status Consult Status: Follow-up Date: 11/11/19 Follow-up type: In-patient    Jecenia Leamer A Higuera Ancidey 11/10/2019, 5:52 PM

## 2019-11-10 NOTE — Progress Notes (Addendum)
When I went in to do rounds and bed side report, pt asked if it was normal to feel some chest heaviness while breathing in. I informed her that it is not very common.  I auscultated heart and lung sounds. Breath sounds were clear, Vitals stable. I called Dr. Melba Coon to inform her and she said it could be heartburn. I got orders for antiacid and told to try that. If it gets worse to call her again. Will continue to monitor.

## 2019-11-10 NOTE — Op Note (Signed)
C-Section Operative Note  Date: 11/10/19  Preoperative Diagnosis: IUP @ 37 5/7, early labor Postoperative Diagnosis: same as above; adhesive disease Procedures: 1) Vacuum-assisted ERLTCS 2) Bilateral salpingectomy Surgeon: Eula Flax, MD Findings: Significant scar tissue noted during abdominal entry. Fascia, rectus bellies and peritoneum appear adhesed together. Viable female infant with APGARS of 9 and 9 at 1 and 5 minutes, respectively. Normal appearing uterus and ovaries BL. BL tubes with appearance of reanastamoses near isthmus with evidence of old suture. Specimens: BL fallopian tubes, palcenta EBL 650 IVF 2000 UOP 100  Jacqueline Course: Jacqueline Huerta @ 37 5/7 found to be in latent labor in MAU making change from ftp to 2cm despite VI fluid boluses. H/o csx x2, satisfied fertility.   Consent:  R/B/A of cesarean section discussed with Jacqueline. Alternative would be vaginal delivery which would mean shorter postpartum stay and decreased risk of bleeding. Risks of section include infection of the uterus, pelvic organs, or skin, inadvertent injury to internal organs, such as bowel or bladder. If there is major injury, extensive surgery may be required. If injury is minor, it may be treated with relative ease. Discussed possibility of excessive blood loss and transfusion. If bleeding cannot be controlled using medical or minor surgical methods, a cesarean hysterectomy may be performed which would mean no future fertility. Jacqueline accepts the possibility of blood transfusion, if necessary. Jacqueline understands and agrees to move forward with section.  Dicussed with Jacqueline R/B/A of bilateral salpingectomy. Alternatives includes internal ligation, LARCs. Discussed increased risk of ectopic pregnancy after procedure (1 in 3 pregnancies post-ligation can be ectopic) and that a pregnancy test should be taken if suspicion arises postpartum. Discussed risk of failure 1/100, backup method should be used  for 2-4wks. Discussed R/B or surgical procedure including bleeding, infection, injury to surrounding organs. Jacqueline agrees to blood products if needed. Discussed that BTL does not protect against STI and that barrier protection should be used. Jacqueline understands and intends to comply.   Operative Procedure: Jacqueline was taken to the operating room where epidural anesthesia was found to be adequate by Allis clamp test. She was prepped and draped in the normal sterile fashion in the dorsal supine position with a leftward tilt. An appropriate time out was performed. A Pfannenstiel skin incision was then made with the scalpel and carried through to the underlying layer of fascia by sharp dissection and Bovie cautery. The fascia was nicked in the midline and the incision was extended laterally with Mayo scissors. Findings as noted above. The superior aspect of the incision was grasped Coker clamps and dissected off the very thin underlying rectus muscles. During dissection, peritoneal cavity found to be entered near uterine fundus. The peritoneal incision was then extended both superiorly and inferiorly with careful attention to avoid both bowel and bladder. The Alexis self-retaining wound retractor wasthen placed within the incision and the lower uterine segment exposed. The bladder flap was developed with Metzenbaum scissors and pushed away from the lower uterine segment. The lower uterine segment was then incised in a ;pw transverse fashion and the cavity itself entered bluntly. The incision was extended bluntly. Amniotic sac was ruptured and fluid was noted to be clear in color. The infant's head was then lifted and attempts made to deliver from the incision in standard manner. Mity vac applied and with two gentle pullswithout difficulty using the standard movements. The remainder of the infant delivered and the nose and mouth bulb suctioned with the cord clamped and cut as well. Loose  nuchal x1, easily reduced.  The infant was handed off to the waiting pediatricians. The placenta was then spontaneously expressed from the uterus and the uterus cleared of all clots and debris with moist lap sponge. The uterine incision was then repaired in 2 layers the first layer was a running locked layer of 0-vicryl and the second an imbricating layer of the same suture. The tubes and ovaries were inspected and the gutters cleared of all clots and debris.   Attention paid to left fallopian tube which was traced to fimbriated ends. Kelly clamps x2 placed across mesosalpinx through isthmic portion. Tube and fimbrae transected with Metzenbaum and specimen passed off field. Transfixion suture x2 with 3-0 vicryl used to achieve homeostasis. Sutures initially tagged and cut sites inspected closely, tag then removed. This process was repeated on the right side and specimen was passed off appropriately. The uterine incision was inspected and found to be hemostatic. All instruments and sponges as well as the Alexis retractor were then removed from the abdomen. The fascia was then closed with 0 Vicryl in a running fashion. Subcutaneous tissue was reapproximated with 3-0 plain in a running fashion. The skin was closed with a subcuticular stitch of 4-0 Vicryl on a Keith needle and then reinforced with Dermabond and a Honeycomb. At the conclusion of the procedure all instruments and sponge counts were correct. Jacqueline was taken to the recovery room in good condition with her baby accompanying her skin to skin.

## 2019-11-10 NOTE — Transfer of Care (Signed)
Immediate Anesthesia Transfer of Care Note  Patient: Jacqueline Huerta  Procedure(s) Performed: CESAREAN SECTION WITH BILATERAL TUBAL LIGATION (N/A )  Patient Location: PACU  Anesthesia Type:Spinal  Level of Consciousness: awake, alert , oriented and patient cooperative  Airway & Oxygen Therapy: Patient Spontanous Breathing  Post-op Assessment: Report given to RN, Post -op Vital signs reviewed and stable and Patient moving all extremities X 4  Post vital signs: Reviewed and stable  Last Vitals:  Vitals Value Taken Time  BP 116/74 11/10/19 0030  Temp    Pulse 88 11/10/19 0034  Resp 16 11/10/19 0034  SpO2 98 % 11/10/19 0034  Vitals shown include unvalidated device data.  Last Pain:  Vitals:   11/09/19 2212  TempSrc: Oral  PainSc:          Complications: No complications documented.

## 2019-11-10 NOTE — Progress Notes (Signed)
Subjective: Postpartum Day 1: Cesarean Delivery/BTL (by salpingectomy) Patient reports incisional pain and tolerating PO.  Nl lochia, pain controlled  Objective: Vital signs in last 24 hours: Temp:  [97.9 F (36.6 C)-98.9 F (37.2 C)] 98.4 F (36.9 C) (09/16 0445) Pulse Rate:  [90-120] 90 (09/16 0345) Resp:  [12-27] 16 (09/16 0445) BP: (105-130)/(69-86) 108/69 (09/16 0345) SpO2:  [95 %-100 %] 95 % (09/16 0345) Weight:  [80.3 kg] 80.3 kg (09/15 2212)  Physical Exam:  General: alert and no distress Lochia: appropriate Uterine Fundus: firm  Recent Labs    11/09/19 1747 11/10/19 0653  HGB 11.5* 10.5*  HCT 37.6 34.8*    Assessment/Plan: Status post Cesarean section. Doing well postoperatively.  Continue current care.  Routine care.    Mohini Heathcock Bovard-Stuckert 11/10/2019, 8:10 AM

## 2019-11-11 ENCOUNTER — Encounter (HOSPITAL_COMMUNITY): Payer: Self-pay | Admitting: Obstetrics and Gynecology

## 2019-11-11 NOTE — Discharge Instructions (Signed)
Call office with any concerns [phone] 

## 2019-11-11 NOTE — Lactation Note (Signed)
This note was copied from a baby's chart. Lactation Consultation Note Baby 45 hrs old. RN called LC d/t mom's nipples cracked scabbed, hurting, had bled, and baby had been cluster feeding.  RN gave coconut oil and comfort gels, knowing not to use together. Mom has short shaft nipples, compressible.  Breast augmentation.  Mom shown how to use DEBP & how to disassemble, clean, & reassemble parts. Mom knows to pump q3h for 15-20 min. Encouraged to use coconut oil w/pumping, comfort gels after BF.  Encouraged to wear shells this am w/bra to assist in everting nipples more and help w/soreness. Mom has #20 NS. Fit well. LC attempted #24 NS. To big for baby's mouth. Latched baby w/#20. Baby took a couple of sucks then slept. Baby wouldn't feed. Encouraged to use to allow nipples to heal and not get damaged any further.  Baby has slight recessed chin. Encouraged mom to do chin tug if needed to widen flange. Has labial thick frenulum and high palate. May have slight posterior frenulum. Baby can move tongue well.  Baby appears slightly jaundice. Baby will be having blood bili serum this am.\ Baby having lots of stools. Baby tired sleeping soundly when [place] left.  Discussed postioning, pillows support and props for feedings.  Reported to RN after consult.  Patient Name: Jacqueline Huerta KWIOX'B Date: 11/11/2019 Reason for consult: Mother's request;Breast augmentation   Maternal Data Has patient been taught Hand Expression?: Yes Does the patient have breastfeeding experience prior to this delivery?: Yes  Feeding    LATCH Score       Type of Nipple: Everted at rest and after stimulation (short shaft)  Comfort (Breast/Nipple): Engorged, cracked, bleeding, large blisters, severe discomfort (cracked, scabbed)        Interventions Interventions: Breast feeding basics reviewed;Coconut oil;Shells;Comfort gels;DEBP;Position options;Breast massage;Support pillows;Skin to  skin;Adjust position;Assisted with latch;Breast compression  Lactation Tools Discussed/Used Tools: Pump;Shells;Comfort gels;Coconut oil;Nipple Shields Nipple shield size: 20 Shell Type: Inverted Breast pump type: Double-Electric Breast Pump Pump Review: Setup, frequency, and cleaning;Milk Storage Initiated by:: Allayne Stack RN IBCLC Date initiated:: 11/11/19   Consult Status Consult Status: Follow-up Date: 11/11/19 Follow-up type: In-patient    Jacqueline Huerta, [name] 11/11/2019, 3:30 AM

## 2019-11-11 NOTE — Progress Notes (Signed)
Subjective: Postpartum Day 2: Cesarean Delivery Patient reports tolerating PO, + flatus and no problems voiding.  Lochia mild. She reports no complaints. She denies HA, SOB or CP. She is bonding well with baby - working on breastfeeding . Has seen lactation consultant  Objective: Vital signs in last 24 hours: Temp:  [97.8 F (36.6 C)-98.3 F (36.8 C)] 97.8 F (36.6 C) (09/17 0552) Pulse Rate:  [65-88] 78 (09/17 0552) Resp:  [14-18] 18 (09/17 0552) BP: (102-122)/(64-73) 104/67 (09/17 0552) SpO2:  [98 %-100 %] 99 % (09/17 0552)  Physical Exam:  General: alert, cooperative and no distress Lochia: appropriate Uterine Fundus: firm Incision: no significant drainage DVT Evaluation: No evidence of DVT seen on physical exam. Calf/Ankle edema is present.  Recent Labs    11/09/19 1747 11/10/19 0653  HGB 11.5* 10.5*  HCT 37.6 34.8*    Assessment/Plan: Status post Cesarean section. Doing well postoperatively.  Continue current care Likely discharge home tomorrow.  Isaiah Serge 11/11/2019, 3:48 PM

## 2019-11-11 NOTE — Lactation Note (Addendum)
This note was copied from a baby's chart. Lactation Consultation Note  Infant is 24 hours old 66 weeks with a 6% weight loss. LC called in to assist Mom with her nipples since she has some pain and abrasions on both nipples. Baby also has elevated bilirubin of 9.4 mg/dl.   LC reviewed with Mom some of the suggestions from the previous [facility] visit. Mom is using the breast shells and 20 NS but states she still has a lot of pain and does not think she can latch at the breast with this feeding. She is using the DEBP and 24 flange and states no discomfort with pumping.  Mom given the comfort gels to place on the nipples since they are sore. Right nipple compression stripe and abrasion. The left nipple compression stripe with healed over scab. Mom notes left is more painful than the right. Mom states she is comforted with the use of the cold comfort gels. Mom will pump with the DEBP at 4:30 pm she wants to rest at this time.   LC attempted to feed infant with the yellow slow flow nipple but infant was gagging and had the hiccups, flow was too fast. LC switched to the purple slow flow nipple and infant able to take 7 ml. Infant with the bottle tends to gag a little as well. Mom instructed to keep infant STS and watch for cues for next feeding. Mom knows to call RN to lay infant down if she gets tired.   Nipple care plan: express breastmilk on the nipples for care, no coconut oil with the use of the comfort gels. Comfort gels are good for 6 days.  When pumping to line the flange with coconut oil to prevent friction.   Feeding plan 1. Start formula with this feed supplementing based on LTPI guidelines given with extra slow flow nipple and paced bottle feeding.                                     2. Pump using the DEBP for at least 15 minute every 3 hours. Mom will line the flange with coconut oil.                        3. With the next feeding, apply heat breast massage and hand express and then latch either at  the breast or with the nipples shield.

## 2019-11-12 MED ORDER — OXYCODONE-ACETAMINOPHEN 5-325 MG PO TABS
1.0000 | ORAL_TABLET | ORAL | 0 refills | Status: AC | PRN
Start: 2019-11-12 — End: 2019-11-19

## 2019-11-12 MED ORDER — IBUPROFEN 800 MG PO TABS
800.0000 mg | ORAL_TABLET | Freq: Three times a day (TID) | ORAL | 0 refills | Status: DC | PRN
Start: 2019-11-12 — End: 2021-09-24

## 2019-11-12 NOTE — Lactation Note (Signed)
This note was copied from a baby's chart. Lactation Consultation Note  Patient Name: Jacqueline Huerta Today's Date: 11/12/2019    SLP coverage is not available today, but I was given permission via text to try Nfant Gold Nipple. No leakage out of the sides of the baby's mouth was noted while trying to use the Gold nipple. Infant appeared more comfortable.    Mom has 2 Nfant purple nipples & 2 Nfant gold nipples. I gave Mom a sheet showing the comparable brands on the market, but did let her know that an SLP would be reaching out to her for an outpatient appt. Mom will try to pump 8 times/day. Mom has Comfort Gels; instructions for use were provided. Mom knows how to reach [place] for any post-discharge questions.   On oral exam, infant with normal palate. Infant does not seem to have a dysfunctional suck. I observed the tip of infant's tongue with a slight heart-shape with certain tongue movements.  [name] [facility] 11/12/2019, 10:45 AM

## 2019-11-12 NOTE — Progress Notes (Signed)
Subjective: Postpartum Day 3: Cesarean Delivery Patient reports tolerating PO, + flatus, + BM and no problems voiding. Still no complaints. Ambulating well. Denies pain, HA, SOB or CP. Baby still working on feeds - new formula started. Bonding well. Desires discharge to home today  Objective: Vital signs in last 24 hours: Temp:  [97.8 F (36.6 C)-98.2 F (36.8 C)] 97.8 F (36.6 C) (09/18 0540) Pulse Rate:  [71-80] 77 (09/18 0540) Resp:  [15-20] 15 (09/18 0540) BP: (105-118)/(60-80) 105/80 (09/18 0540) SpO2:  [99 %-100 %] 99 % (09/17 2029)  Physical Exam:  General: alert, cooperative and no distress Lochia: appropriate Uterine Fundus: firm Incision: no significant drainage DVT Evaluation: No evidence of DVT seen on physical exam. Calf/Ankle edema is presen- mild  Recent Labs    11/09/19 1747 11/10/19 0653  HGB 11.5* 10.5*  HCT 37.6 34.8*    Assessment/Plan: Status post Cesarean section. Doing well postoperatively.  Discharge home with standard precautions and return to clinic in 2 weeks for incision check and 6 weeks for postpartum visit  Venetia Night Yehya Brendle 11/12/2019, 11:04 AM

## 2019-11-12 NOTE — Discharge Summary (Signed)
Postpartum Discharge Summary  Date of Service updated      Patient Name: Jacqueline Huerta DOB: Aug 17, 1980 MRN: [id number]  Date of admission: 11/09/2019 Delivery date:11/09/2019  Delivering provider: Deliah Boston  Date of discharge: 11/12/2019  Admitting diagnosis: Maternal care due to low transverse uterine scar from previous cesarean delivery [O34.211] History of cesarean section [Z98.891] Intrauterine pregnancy: [redacted]w[redacted]d    Secondary diagnosis:  Active Problems:   Maternal care due to low transverse uterine scar from previous cesarean delivery   History of cesarean section  Additional problems: gestational hypertension    Discharge diagnosis: Term Pregnancy Delivered and Gestational Hypertension                                              Post partum procedures:none Augmentation: N/A Complications: None  Hospital course: Sceduled C/S   39y.o. yo [id number] admitted to the hospital 11/09/2019 for scheduled cesarean section with the following indication:Elective Repeat.Delivery details are as follows:  Membrane Rupture Time/Date: 11:11 AM ,11/09/2019   Delivery Method:C-Section, Vacuum Assisted  Details of operation can be found in separate operative note.  Patient had an uncomplicated postpartum course.  She is ambulating, tolerating a regular diet, passing flatus, and urinating well. Patient is discharged home in stable condition on  11/12/19        Newborn Data: Birth date:11/09/2019  Birth time:11:14 PM  Gender:Female  Living status:Living  Apgars:9 ,9  Weight:2988 g     Magnesium Sulfate received: No BMZ received: No Rhophylac:N/A MMR:N/A T-DaP:Given prenatally Flu: N/A Transfusion:Yes  Physical exam  Vitals:   11/11/19 0552 11/11/19 1540 11/11/19 2029 11/12/19 0540  BP: 104/67 110/60 118/64 105/80  Pulse: 78 80 71 77  Resp: 18 20 18 15   Temp: 97.8 F (36.6 C) 98 F (36.7 C) 98.2 F (36.8 C) 97.8 F (36.6 C)  TempSrc:   Oral Oral  SpO2:  99% 100% 99%   Weight:      Height:       General: alert, cooperative and no distress Lochia: appropriate Uterine Fundus: firm Incision: Dressing is clean, dry, and intact DVT Evaluation: No evidence of DVT seen on physical exam. Calf/Ankle edema is present Labs: Lab Results  Component Value Date   WBC 16.0 (H) 11/10/2019   HGB 10.5 (L) 11/10/2019   HCT 34.8 (L) 11/10/2019   MCV 84.1 11/10/2019   PLT 210 11/10/2019   CMP Latest Ref Rng & Units 11/09/2019  Glucose 70 - 99 mg/dL 77  BUN 6 - 20 mg/dL 7  Creatinine 0.44 - 1.00 mg/dL 0.72  Sodium 135 - 145 mmol/L 135  Potassium 3.5 - 5.1 mmol/L 3.8  Chloride 98 - 111 mmol/L 104  CO2 22 - 32 mmol/L 22  Calcium 8.9 - 10.3 mg/dL 8.8(L)  Total Protein 6.5 - 8.1 g/dL 6.1(L)  Total Bilirubin 0.3 - 1.2 mg/dL 0.4  Alkaline Phos 38 - 126 U/L 409(H)  AST 15 - 41 U/L 20  ALT 0 - 44 U/L 16   Edinburgh Score: Edinburgh Postnatal Depression Scale Screening Tool 11/10/2019  I have been able to laugh and see the funny side of things. 0  I have looked forward with enjoyment to things. 0  I have blamed myself unnecessarily when things went wrong. 0  I have been anxious or worried for no good reason. 0  I have felt scared or panicky for no good reason. 0  Things have been getting on top of me. 0  I have been so unhappy that I have had difficulty sleeping. 0  I have felt sad or miserable. 0  I have been so unhappy that I have been crying. 0  The thought of harming myself has occurred to me. 0  Edinburgh Postnatal Depression Scale Total 0      After visit meds:  Allergies as of 11/12/2019      Reactions   Nitrofurantoin Nausea And Vomiting      Medication List    STOP taking these medications   aspirin 81 MG chewable tablet     TAKE these medications   hydrOXYzine 25 MG tablet Commonly known as: ATARAX/VISTARIL Take 25 mg by mouth 3 (three) times daily as needed.   ibuprofen 800 MG tablet Commonly known as: ADVIL Take 1  tablet (800 mg total) by mouth every 8 (eight) hours as needed for moderate pain or cramping.   oxyCODONE-acetaminophen 5-325 MG tablet Commonly known as: Percocet Take 1 tablet by mouth every 4 (four) hours as needed for up to 7 days for severe pain.   PRENATAL VITAMIN PO Take 1 capsule by mouth daily.   pseudoephedrine-acetaminophen 30-500 MG Tabs tablet Commonly known as: TYLENOL SINUS Take 1 tablet by mouth every 4 (four) hours as needed.        Discharge home in stable condition Infant Feeding: Bottle Infant Disposition:home with mother Discharge instruction: per After Visit Summary and Postpartum booklet. Activity: Advance as tolerated. Pelvic rest for 6 weeks.  Diet: routine diet Anticipated Birth Control: BTL done PP Postpartum Appointment:6 weeks Additional Postpartum F/U: Incision check 2 weeks Future Appointments:No future appointments. Follow up Visit:  Follow-up Information    Shivaji, Melida Quitter, MD. Schedule an appointment as soon as possible for a visit today.   Specialty: Obstetrics and Gynecology Why: 2 weeks for incision check and 6 weeks for postpartum visit Contact information: [address] [place] [address] [phone]                   11/12/2019 Isaiah Serge, DO

## 2019-11-12 NOTE — Lactation Note (Signed)
This note was copied from a baby's chart. Lactation Consultation Note  Patient Name: Girl Ouita Nish ANVBT'Y Date: 11/12/2019   Infant is 42 hrs old. Mom has compression stripes bilaterally. The compression stripe on her L breast is more severe than her R breast. I suggested that Mom pump instead of latching at this time, as Mom reports that she dreads latching & is unsure infant is getting anything.   Mom reported having "really small breasts" prior to her augmentation 12 years ago. Mom reports not making much milk with her last child: out of 1 breast she reports hardly making any at all.   Mom reports that when infant bottle feeds, she gulps and formula comes out of the sides of her mouth. I used the Nfant Slow Flow nipple. Gulping was not noted, but quick swallows were noted, initially. Formula was observed coming out of the sides of her mouth at times & infant seemed to be limiting her volumes.   I left a message for speech therapy & paged them, but am unsure of their coverage for the weekend. Jerene Pitch, RN will ask pediatrician for an order for an SLP feeding assessment.   [name] [facility] 11/12/2019, 9:49 AM

## 2019-11-13 LAB — BPAM RBC
Blood Product Expiration Date: 202110102359
Blood Product Expiration Date: 202110112359
Blood Product Expiration Date: 202110122359
Blood Product Expiration Date: 202110122359
Unit Type and Rh: 5100
Unit Type and Rh: 5100
Unit Type and Rh: 5100
Unit Type and Rh: 5100

## 2019-11-13 LAB — TYPE AND SCREEN
ABO/RH(D): O POS
Antibody Screen: POSITIVE
Donor AG Type: NEGATIVE
Donor AG Type: NEGATIVE
Donor AG Type: NEGATIVE
Donor AG Type: NEGATIVE
Unit division: 0
Unit division: 0
Unit division: 0
Unit division: 0

## 2019-11-14 LAB — SURGICAL PATHOLOGY

## 2019-11-16 ENCOUNTER — Other Ambulatory Visit (HOSPITAL_COMMUNITY)
Admission: RE | Admit: 2019-11-16 | Discharge: 2019-11-16 | Disposition: A | Discharge status: Home / Self Care | Payer: BC Managed Care – PPO | Source: Ambulatory Visit | Attending: Obstetrics and Gynecology | Admitting: Obstetrics and Gynecology

## 2019-11-18 ENCOUNTER — Inpatient Hospital Stay (HOSPITAL_COMMUNITY): Admit: 2019-11-18 | Payer: BC Managed Care – PPO | Admitting: Obstetrics and Gynecology

## 2021-02-26 IMAGING — US US ABDOMEN LIMITED
1 series · 15 of 25 positions shown · non-contrast
Comparison: None.

CLINICAL DATA: Upper abdominal pain

EXAM:
ULTRASOUND ABDOMEN LIMITED RIGHT UPPER QUADRANT

[Series 1: us abdomen limited · 15 of 58 slices shown]
[im 1/58]
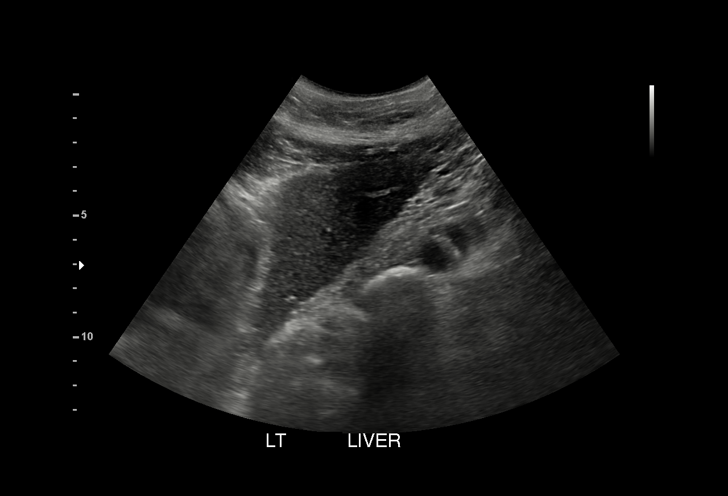
[im 5/58]
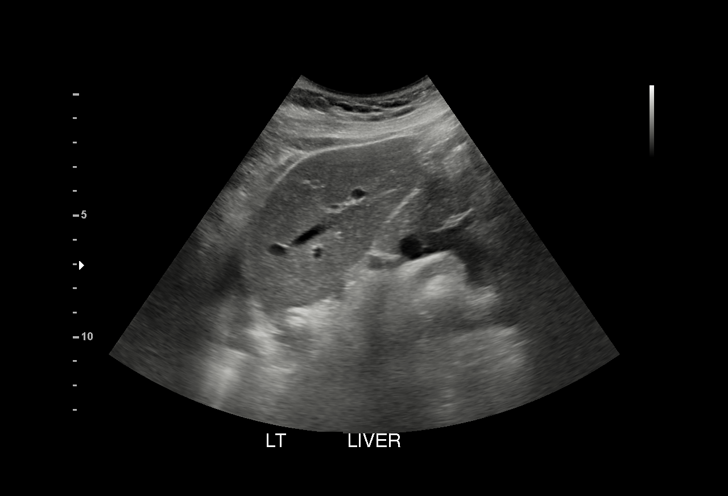
[im 10/58]
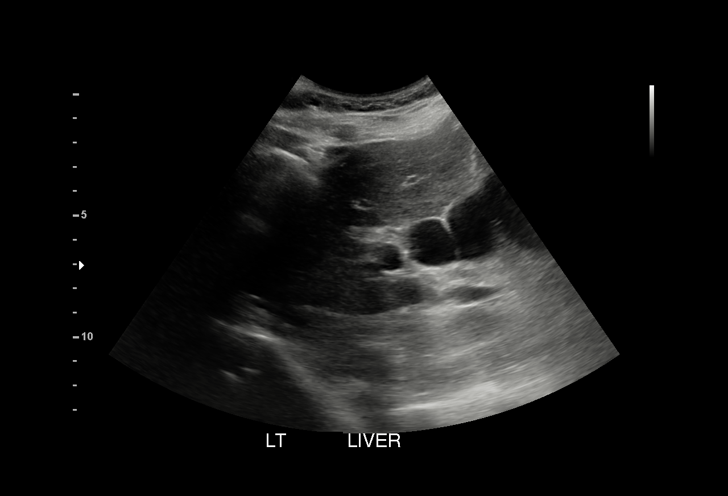
[im 12/58]
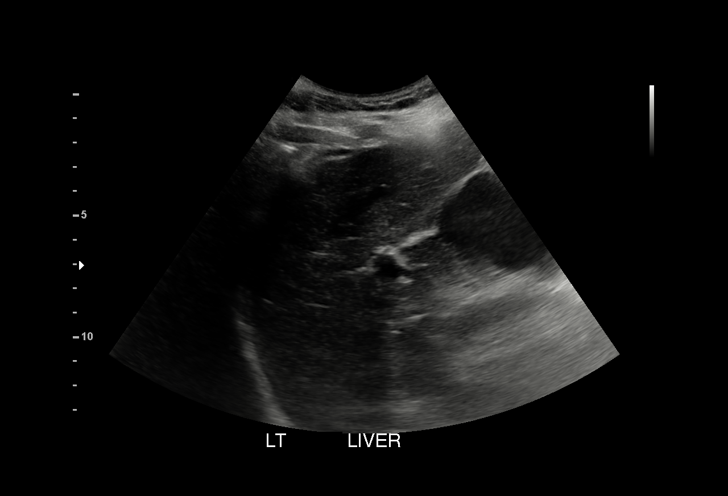
[im 17/58]
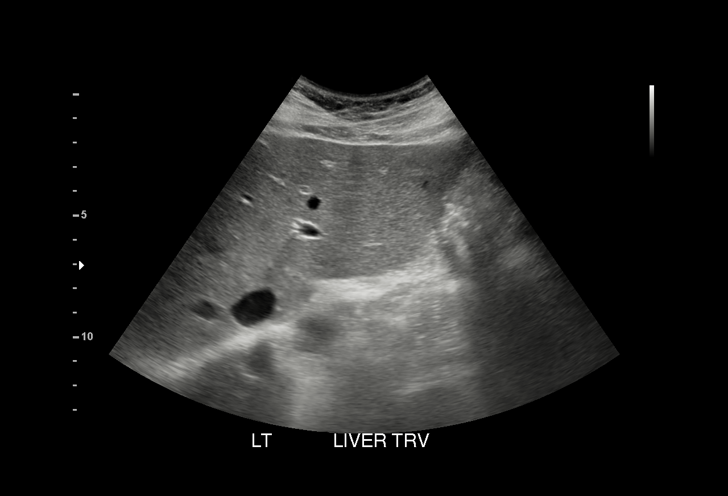
[im 22/58]
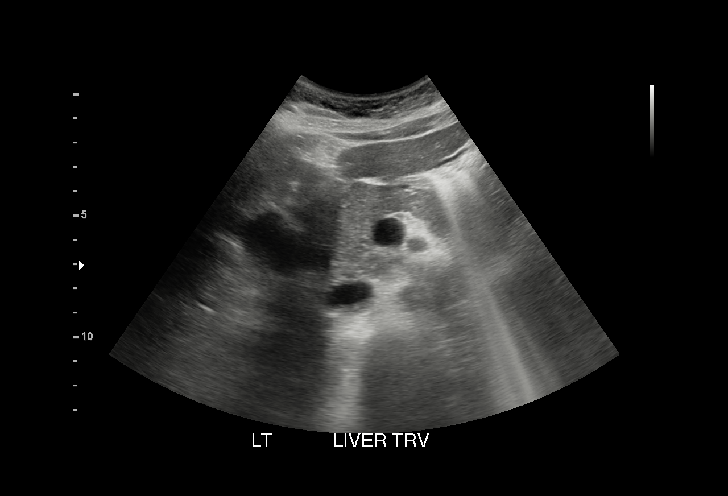
[im 24/58]
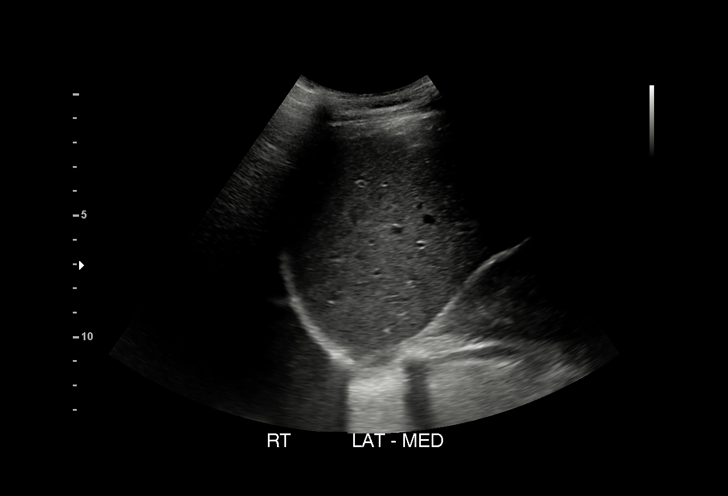
[im 29/58]
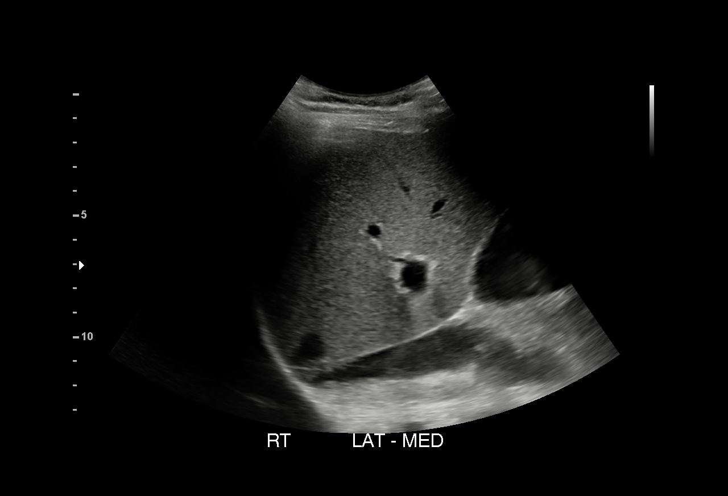
[im 34/58]
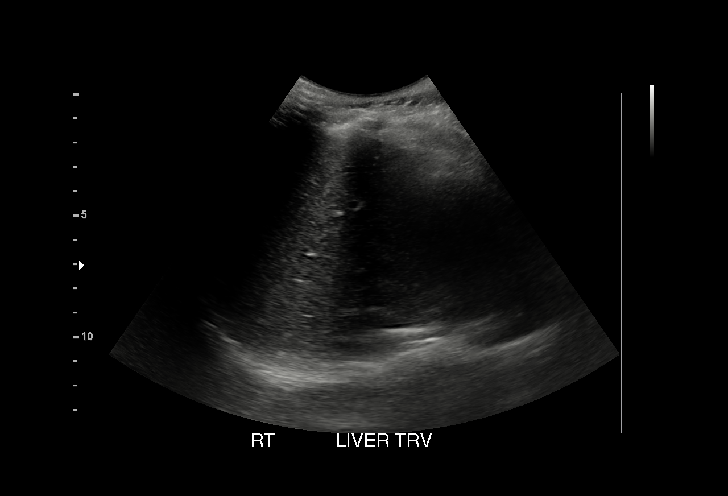
[im 36/58]
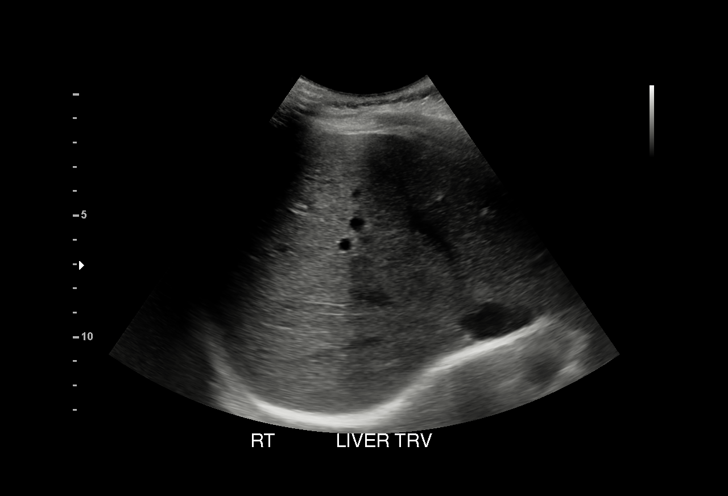
[im 41/58]
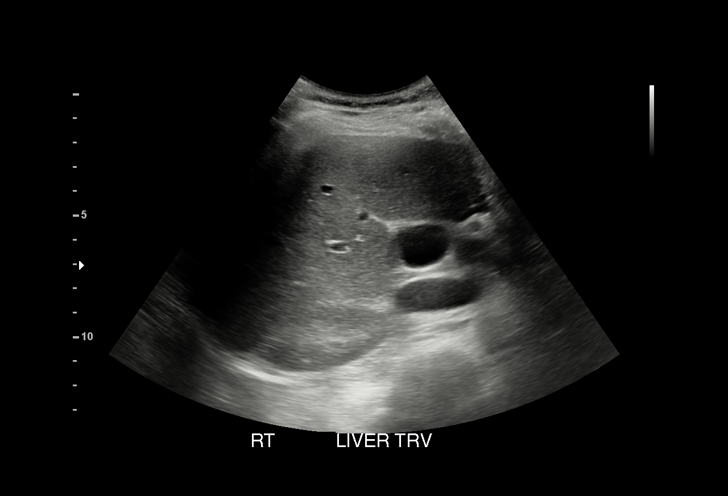
[im 46/58]
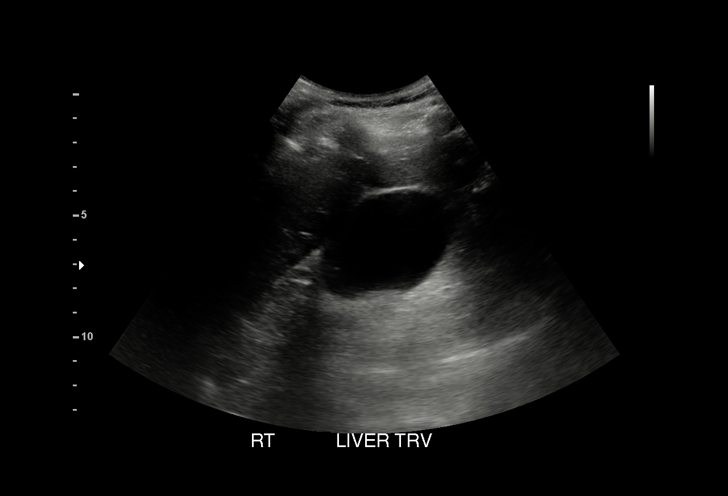
[im 48/58]
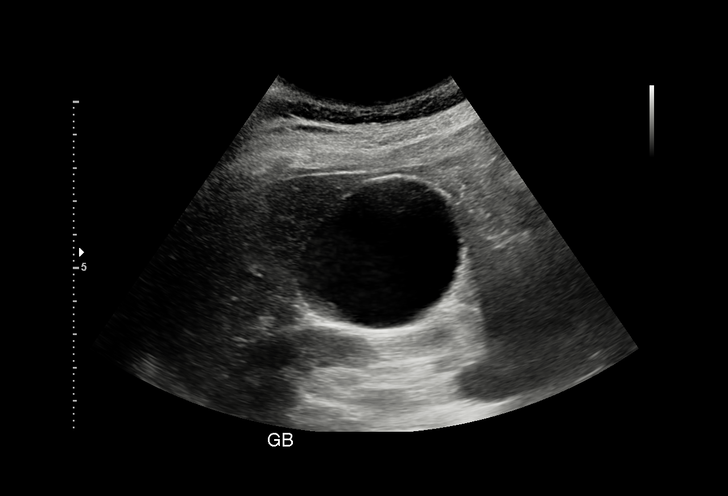
[im 53/58]
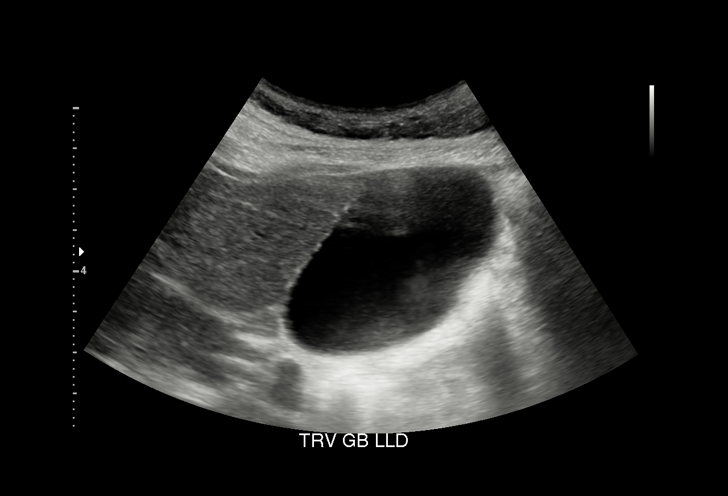
[im 58/58]
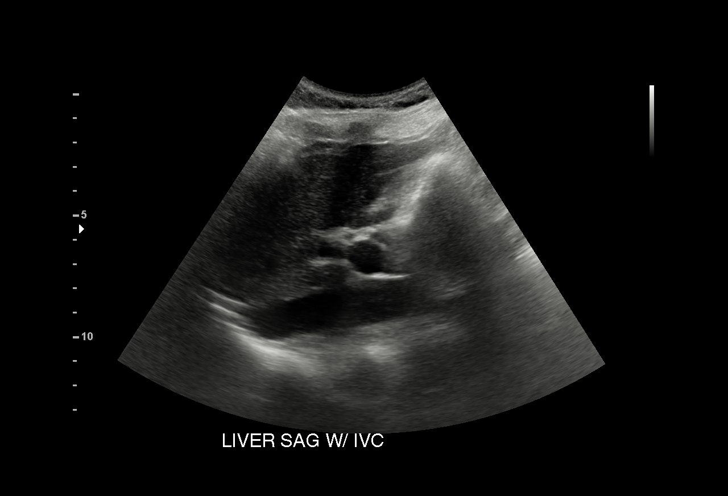

[15 of 25 positions shown; findings below may reference images not displayed]

FINDINGS: Gallbladder:

No gallstones or wall thickening visualized. No pericholecystic
fluid. No sonographic Murphy sign noted by sonographer.

Common bile duct:

Diameter: 2 mm. No intrahepatic or extrahepatic biliary duct
dilatation.

Liver:

No focal lesion identified. Within normal limits in parenchymal
echogenicity. Portal vein is patent on color Doppler imaging with
normal direction of blood flow towards the liver.

Other: Normal appearing pancreas.
IMPRESSION: Study within normal limits.

## 2021-07-16 ENCOUNTER — Emergency Department (INDEPENDENT_AMBULATORY_CARE_PROVIDER_SITE_OTHER)
Admission: EM | Admit: 2021-07-16 | Discharge: 2021-07-16 | Disposition: A | Discharge status: Home / Self Care | Payer: 59 | Source: Home / Self Care | Attending: Family Medicine | Admitting: Family Medicine

## 2021-07-16 DIAGNOSIS — N3090 Cystitis, unspecified without hematuria: Secondary | ICD-10-CM | POA: Diagnosis not present

## 2021-07-16 DIAGNOSIS — R309 Painful micturition, unspecified: Secondary | ICD-10-CM

## 2021-07-16 LAB — POCT URINALYSIS DIP (MANUAL ENTRY)
Bilirubin, UA: NEGATIVE
Blood, UA: NEGATIVE
Glucose, UA: NEGATIVE mg/dL
Ketones, POC UA: NEGATIVE mg/dL
Nitrite, UA: NEGATIVE
Protein Ur, POC: NEGATIVE mg/dL
Spec Grav, UA: 1.01 (ref 1.010–1.025)
Urobilinogen, UA: 0.2 E.U./dL
pH, UA: 6.5 (ref 5.0–8.0)

## 2021-07-16 MED ORDER — CEPHALEXIN 500 MG PO CAPS: 500.0000 mg | ORAL_CAPSULE | Freq: Two times a day (BID) | ORAL | 0 refills | Status: DC

## 2021-07-16 MED ORDER — FLUCONAZOLE 150 MG PO TABS: 150.0000 mg | ORAL_TABLET | Freq: Every day | ORAL | 0 refills | Status: DC

## 2021-07-16 NOTE — ED Triage Notes (Signed)
Pt states that she has some pain with urination. X2 days

## 2021-07-16 NOTE — ED Provider Notes (Signed)
[name]    CSN: [id number] Arrival date & time: 07/16/21  1011      History   Chief Complaint Chief Complaint  Patient presents with   Dysuria    Pain with urination. X2 days    HPI Jacqueline Huerta is a 41 y.o. female.   HPI  41 year old woman, married and in good health, who is here for dysuria and frequency for 2 days.  No abdominal pain.  No nausea or vomiting.  No flank pain.  No history of kidney stone or kidney infection.  Patient states is not pregnant  Past Medical History:  Diagnosis Date   Anxiety    Breast tenderness in female    Dysplastic nevus    History of abnormal cervical Pap smear    Hypertension     Patient Active Problem List   Diagnosis Date Noted   History of cesarean section 11/10/2019   Maternal care due to low transverse uterine scar from previous cesarean delivery 11/09/2019   Epigastric pain 04/27/2018   Nausea without vomiting 04/27/2018   Tachycardia with heart rate 100-120 beats per minute 04/27/2018   Pelvic pain in female 04/21/2018   Menstrual cycle disorder 05/29/2017   History of bilateral tubal ligation 05/29/2017   Family history of colon cancer 04/17/2017   Atypical nevus of back 04/17/2017   History of abnormal cervical Pap smear    Breast tenderness in female     Past Surgical History:  Procedure Laterality Date   BREAST ENHANCEMENT SURGERY     CESAREAN SECTION     x 2   CESAREAN SECTION WITH BILATERAL TUBAL LIGATION N/A 11/09/2019   Procedure: CESAREAN SECTION WITH BILATERAL TUBAL LIGATION;  Surgeon: Deliah Boston, MD;  Location: [facility];  Service: Obstetrics;  Laterality: N/A;  request RNFA   LASER ABLATION OF THE CERVIX  2009   MOLE REMOVAL     TUBAL LIGATION      OB History     Gravida  4   Para  3   Term  3   Preterm      AB  1   Living  3      SAB  1   IAB      Ectopic      Multiple  0   Live Births  2            Home Medications    Prior to  Admission medications   Medication Sig Start Date End Date Taking? Authorizing Provider  cephALEXin (KEFLEX) 500 MG capsule Take 1 capsule (500 mg total) by mouth 2 (two) times daily. 07/16/21  Yes Raylene Everts, MD  fluconazole (DIFLUCAN) 150 MG tablet Take 1 tablet (150 mg total) by mouth daily. Repeat in 1 week if needed 07/16/21  Yes Raylene Everts, MD  hydrOXYzine (ATARAX/VISTARIL) 25 MG tablet Take 25 mg by mouth 3 (three) times daily as needed. 10/10/19   [provider]  ibuprofen (ADVIL) 800 MG tablet Take 1 tablet (800 mg total) by mouth every 8 (eight) hours as needed for moderate pain or cramping. 11/12/19   Banga, Bonnee Quin, DO  Prenatal Vit-Fe Fumarate-FA (PRENATAL VITAMIN PO) Take 1 capsule by mouth daily.    [provider]  pseudoephedrine-acetaminophen (TYLENOL SINUS) 30-500 MG TABS tablet Take 1 tablet by mouth every 4 (four) hours as needed.    [provider]    Family History Family History  Problem Relation Age of Onset  Hyperlipidemia Mother    Alcohol abuse Father    Lung cancer Maternal Aunt    Heart attack Maternal Grandfather    Colon cancer Paternal Grandmother     Social History Social History   Tobacco Use   Smoking status: Never   Smokeless tobacco: Never  Vaping Use   Vaping Use: Never used  Substance Use Topics   Alcohol use: Not Currently    Alcohol/week: 1.0 standard drink    Types: 1 Standard drinks or equivalent per week   Drug use: No     Allergies   Nitrofurantoin   Review of Systems Review of Systems See HPI  Physical Exam Triage Vital Signs ED Triage Vitals [07/16/21 1024]  Enc Vitals Group     BP      Pulse      Resp      Temp      Temp src      SpO2      Weight 127 lb (57.6 kg)     Height '5\' 2"'$  (1.575 m)     Head Circumference      Peak Flow      Pain Score 2     Pain Loc      Pain Edu?      Excl. in [place]?    No data found.  Updated Vital Signs BP 102/70 (BP Location:  Right Arm)   Pulse 94   Temp 98.3 F (36.8 C) (Oral)   Resp 18   Ht '5\' 2"'$  (1.575 m)   Wt 57.6 kg   LMP 07/02/2021   SpO2 96%   Breastfeeding No   BMI 23.23 kg/m      Physical Exam Constitutional:      General: She is not in acute distress.    Appearance: She is well-developed.  HENT:     Head: Normocephalic and atraumatic.  Eyes:     Conjunctiva/sclera: Conjunctivae normal.     Pupils: Pupils are equal, round, and reactive to light.  Cardiovascular:     Rate and Rhythm: Normal rate.  Pulmonary:     Effort: Pulmonary effort is normal. No respiratory distress.  Abdominal:     General: There is no distension.     Palpations: Abdomen is soft.     Tenderness: There is no abdominal tenderness. There is no right CVA tenderness or left CVA tenderness.  Musculoskeletal:        General: Normal range of motion.     Cervical back: Normal range of motion.  Skin:    General: Skin is warm and dry.  Neurological:     General: No focal deficit present.     Mental Status: She is alert.  Psychiatric:        Mood and Affect: Mood normal.        Behavior: Behavior normal.     UC Treatments / Results  Labs (all labs ordered are listed, but only abnormal results are displayed) Labs Reviewed  POCT URINALYSIS DIP (MANUAL ENTRY) - Abnormal; Notable for the following components:      Result Value   Leukocytes, UA Small (1+) (*)    All other components within normal limits  URINE CULTURE    EKG   Radiology No results found.  Procedures Procedures (including critical care time)  Medications Ordered in UC Medications - No data to display  Initial Impression / Assessment and Plan / UC Course  I have reviewed the triage vital signs and the nursing notes.  Pertinent labs & imaging results that were available during my care of the patient were reviewed by me and considered in my medical decision making (see chart for details).     Symptoms are suggestive of cystitis.  We will  culture the urine.  I am going to place her on cephalexin for treatment.  I am giving her Diflucan in case she develops yeast infection symptoms.  Follow-up with primary care Final Clinical Impressions(s) / UC Diagnoses   Final diagnoses:  Pain with urination  Cystitis     Discharge Instructions      Drink lots of water Take the antibiotic 2 times a day.  Take 2 doses today Fill and take the Diflucan if needed for  yeast infection symptoms Follow-up with your primary care doctor      ED Prescriptions     Medication Sig Dispense Auth. Provider   cephALEXin (KEFLEX) 500 MG capsule Take 1 capsule (500 mg total) by mouth 2 (two) times daily. 10 capsule Raylene Everts, MD   fluconazole (DIFLUCAN) 150 MG tablet Take 1 tablet (150 mg total) by mouth daily. Repeat in 1 week if needed 2 tablet Raylene Everts, MD      PDMP not reviewed this encounter.   Raylene Everts, MD 07/16/21 1044

## 2021-07-16 NOTE — Discharge Instructions (Signed)
Drink lots of water Take the antibiotic 2 times a day.  Take 2 doses today Fill and take the Diflucan if needed for  yeast infection symptoms Follow-up with your primary care doctor

## 2021-07-18 LAB — URINE CULTURE
MICRO NUMBER:: 13433476
Result:: NO GROWTH
SPECIMEN QUALITY:: ADEQUATE

## 2021-09-24 ENCOUNTER — Emergency Department
Admission: EM | Admit: 2021-09-24 | Discharge: 2021-09-24 | Disposition: A | Discharge status: Home / Self Care | Payer: 59 | Attending: Family Medicine | Admitting: Family Medicine

## 2021-09-24 DIAGNOSIS — J0100 Acute maxillary sinusitis, unspecified: Secondary | ICD-10-CM

## 2021-09-24 DIAGNOSIS — J069 Acute upper respiratory infection, unspecified: Secondary | ICD-10-CM

## 2021-09-24 MED ORDER — AMOXICILLIN 875 MG PO TABS: 875.0000 mg | ORAL_TABLET | Freq: Two times a day (BID) | ORAL | 0 refills | Status: DC

## 2021-09-24 MED ORDER — FLUCONAZOLE 150 MG PO TABS: 150.0000 mg | ORAL_TABLET | Freq: Every day | ORAL | 0 refills | Status: DC

## 2021-09-24 MED ORDER — FLUTICASONE PROPIONATE 50 MCG/ACT NA SUSP: 2.0000 | Freq: Every day | NASAL | 0 refills | Status: DC

## 2021-09-24 NOTE — ED Triage Notes (Signed)
Pt presents with rt side facial pain, productive cough, and low grade fever that began today. Pt is concerned for sinus infection. Pt states her family has had similar sx but has not been diagnosed with anything at this time.

## 2021-09-24 NOTE — Discharge Instructions (Signed)
Use Flonase as directed 2 times a day for the first couple days then once a day Take the Augmentin 2 times a day for a week Take Diflucan if needed Drink lots of fluids Run a humidifier in your bedroom if you have 1 available

## 2021-09-24 NOTE — ED Provider Notes (Signed)
[name]    CSN: [id number] Arrival date & time: 09/24/21  1550      History   Chief Complaint Chief Complaint  Patient presents with   Fever   Cough    HPI Nashika Coker is a 41 y.o. female.   HPI  Patient states she has had an upper respiratory infection.  The whole family has.  She states that she was getting better and then developed pressure and pain in her right cheek and her right upper teeth.  She also has some yellow and green rhinorrhea.  She had a low-grade fever and a productive cough.  She states that initially her virus had clear runny nose, but this right-sided facial pain is new.  Concern for sinusitis  Past Medical History:  Diagnosis Date   Anxiety    Breast tenderness in female    Dysplastic nevus    History of abnormal cervical Pap smear    Hypertension     Patient Active Problem List   Diagnosis Date Noted   History of cesarean section 11/10/2019   Maternal care due to low transverse uterine scar from previous cesarean delivery 11/09/2019   Epigastric pain 04/27/2018   Nausea without vomiting 04/27/2018   Tachycardia with heart rate 100-120 beats per minute 04/27/2018   Pelvic pain in female 04/21/2018   Menstrual cycle disorder 05/29/2017   History of bilateral tubal ligation 05/29/2017   Family history of colon cancer 04/17/2017   Atypical nevus of back 04/17/2017   History of abnormal cervical Pap smear    Breast tenderness in female     Past Surgical History:  Procedure Laterality Date   BREAST ENHANCEMENT SURGERY     CESAREAN SECTION     x 2   CESAREAN SECTION WITH BILATERAL TUBAL LIGATION N/A 11/09/2019   Procedure: CESAREAN SECTION WITH BILATERAL TUBAL LIGATION;  Surgeon: Deliah Boston, MD;  Location: [facility];  Service: Obstetrics;  Laterality: N/A;  request RNFA   LASER ABLATION OF THE CERVIX  2009   MOLE REMOVAL     TUBAL LIGATION      OB History     Gravida  4   Para  3   Term  3   Preterm       AB  1   Living  3      SAB  1   IAB      Ectopic      Multiple  0   Live Births  2            Home Medications    Prior to Admission medications   Medication Sig Start Date End Date Taking? Authorizing Provider  amoxicillin (AMOXIL) 875 MG tablet Take 1 tablet (875 mg total) by mouth 2 (two) times daily. 09/24/21  Yes Raylene Everts, MD  fluconazole (DIFLUCAN) 150 MG tablet Take 1 tablet (150 mg total) by mouth daily. Repeat in 1 week if needed 09/24/21  Yes Raylene Everts, MD  fluticasone [facility]) 50 MCG/ACT nasal spray Place 2 sprays into both nostrils daily. 09/24/21  Yes Raylene Everts, MD    Family History Family History  Problem Relation Age of Onset   Hyperlipidemia Mother    Alcohol abuse Father    Lung cancer Maternal Aunt    Heart attack Maternal Grandfather    Colon cancer Paternal Grandmother     Social History Social History   Tobacco Use   Smoking status: Never  Smokeless tobacco: Never  Vaping Use   Vaping Use: Never used  Substance Use Topics   Alcohol use: Not Currently    Alcohol/week: 1.0 standard drink of alcohol    Types: 1 Standard drinks or equivalent per week   Drug use: No     Allergies   Nitrofurantoin   Review of Systems Review of Systems See HPI  Physical Exam Triage Vital Signs ED Triage Vitals  Enc Vitals Group     BP 09/24/21 1603 112/77     Pulse Rate 09/24/21 1603 94     Resp 09/24/21 1603 14     Temp 09/24/21 1603 98.7 F (37.1 C)     Temp Source 09/24/21 1603 Oral     SpO2 09/24/21 1603 97 %     Weight --      Height --      Head Circumference --      Peak Flow --      Pain Score 09/24/21 1602 1     Pain Loc --      Pain Edu? --      Excl. in [place]? --    No data found.  Updated Vital Signs BP 112/77 (BP Location: Right Arm)   Pulse 94   Temp 98.7 F (37.1 C) (Oral)   Resp 14   SpO2 97%      Physical Exam Constitutional:      General: She is not in acute distress.     Appearance: She is well-developed and normal weight.  HENT:     Head: Normocephalic and atraumatic.     Right Ear: Tympanic membrane, ear canal and external ear normal.     Left Ear: Tympanic membrane, ear canal and external ear normal.     Nose: Congestion and rhinorrhea present.     Comments: Tender right maxillary and ethmoid sinuses    Mouth/Throat:     Pharynx: Posterior oropharyngeal erythema present.  Eyes:     Conjunctiva/sclera: Conjunctivae normal.     Pupils: Pupils are equal, round, and reactive to light.  Cardiovascular:     Rate and Rhythm: Normal rate and regular rhythm.     Heart sounds: Normal heart sounds.  Pulmonary:     Effort: Pulmonary effort is normal. No respiratory distress.     Breath sounds: Normal breath sounds. No rhonchi.  Abdominal:     General: There is no distension.     Palpations: Abdomen is soft.  Musculoskeletal:        General: Normal range of motion.     Cervical back: Normal range of motion.  Lymphadenopathy:     Cervical: Cervical adenopathy present.  Skin:    General: Skin is warm and dry.  Neurological:     Mental Status: She is alert.      UC Treatments / Results  Labs (all labs ordered are listed, but only abnormal results are displayed) Labs Reviewed - No data to display  EKG   Radiology No results found.  Procedures Procedures (including critical care time)  Medications Ordered in UC Medications - No data to display  Initial Impression / Assessment and Plan / UC Course  I have reviewed the triage vital signs and the nursing notes.  Pertinent labs & imaging results that were available during my care of the patient were reviewed by me and considered in my medical decision making (see chart for details).     Final Clinical Impressions(s) / UC Diagnoses   Final diagnoses:  Acute maxillary sinusitis, recurrence not specified  Upper respiratory tract infection, unspecified type     Discharge Instructions       Use Flonase as directed 2 times a day for the first couple days then once a day Take the Augmentin 2 times a day for a week Take Diflucan if needed Drink lots of fluids Run a humidifier in your bedroom if you have 1 available   ED Prescriptions     Medication Sig Dispense Auth. Provider   amoxicillin (AMOXIL) 875 MG tablet Take 1 tablet (875 mg total) by mouth 2 (two) times daily. 14 tablet Raylene Everts, MD   fluticasone [facility]) 50 MCG/ACT nasal spray Place 2 sprays into both nostrils daily. 16 g Raylene Everts, MD   fluconazole (DIFLUCAN) 150 MG tablet Take 1 tablet (150 mg total) by mouth daily. Repeat in 1 week if needed 2 tablet Raylene Everts, MD      PDMP not reviewed this encounter.   Raylene Everts, MD 09/24/21 1655

## 2021-09-26 ENCOUNTER — Telehealth: Payer: Self-pay

## 2021-09-26 NOTE — Telephone Encounter (Signed)
Returned pts call from nurse line yesterday. No answer. Left VM advising pt to call back if any questions or concerns.

## 2021-09-29 ENCOUNTER — Ambulatory Visit
Admission: EM | Admit: 2021-09-29 | Discharge: 2021-09-29 | Disposition: A | Discharge status: Home / Self Care | Payer: 59 | Attending: Family Medicine | Admitting: Family Medicine

## 2021-09-29 DIAGNOSIS — J0100 Acute maxillary sinusitis, unspecified: Secondary | ICD-10-CM | POA: Diagnosis not present

## 2021-09-29 MED ORDER — PREDNISONE 20 MG PO TABS
40.0000 mg | ORAL_TABLET | Freq: Every day | ORAL | 0 refills | Status: DC
Start: 2021-09-29 — End: 2021-10-09

## 2021-09-29 MED ORDER — DOXYCYCLINE HYCLATE 100 MG PO CAPS: 100.0000 mg | ORAL_CAPSULE | Freq: Two times a day (BID) | ORAL | 0 refills | Status: DC

## 2021-09-29 NOTE — ED Provider Notes (Signed)
[name]    CSN: [id number] Arrival date & time: 09/29/21  1421      History   Chief Complaint Chief Complaint  Patient presents with   Nasal Congestion    HPI Jacqueline Huerta is a 41 y.o. female.   HPI  Patient was here last week.  That note is reviewed.  She had respiratory symptoms for a week and then the acute onset of worsening right-sided facial pain.  She has been on Augmentin for 5 days and Flonase.  She is no better.  She still has pressure and pain in her right cheek, purulent drainage from her right nostril, and sore throat.  Past Medical History:  Diagnosis Date   Anxiety    Breast tenderness in female    Dysplastic nevus    History of abnormal cervical Pap smear    Hypertension     Patient Active Problem List   Diagnosis Date Noted   History of cesarean section 11/10/2019   Maternal care due to low transverse uterine scar from previous cesarean delivery 11/09/2019   Epigastric pain 04/27/2018   Nausea without vomiting 04/27/2018   Tachycardia with heart rate 100-120 beats per minute 04/27/2018   Pelvic pain in female 04/21/2018   Menstrual cycle disorder 05/29/2017   History of bilateral tubal ligation 05/29/2017   Family history of colon cancer 04/17/2017   Atypical nevus of back 04/17/2017   History of abnormal cervical Pap smear    Breast tenderness in female     Past Surgical History:  Procedure Laterality Date   BREAST ENHANCEMENT SURGERY     CESAREAN SECTION     x 2   CESAREAN SECTION WITH BILATERAL TUBAL LIGATION N/A 11/09/2019   Procedure: CESAREAN SECTION WITH BILATERAL TUBAL LIGATION;  Surgeon: Deliah Boston, MD;  Location: [facility];  Service: Obstetrics;  Laterality: N/A;  request RNFA   LASER ABLATION OF THE CERVIX  2009   MOLE REMOVAL     TUBAL LIGATION      OB History     Gravida  4   Para  3   Term  3   Preterm      AB  1   Living  3      SAB  1   IAB      Ectopic      Multiple  0    Live Births  2            Home Medications    Prior to Admission medications   Medication Sig Start Date End Date Taking? Authorizing Provider  doxycycline (VIBRAMYCIN) 100 MG capsule Take 1 capsule (100 mg total) by mouth 2 (two) times daily. 09/29/21  Yes Raylene Everts, MD  predniSONE (DELTASONE) 20 MG tablet Take 2 tablets (40 mg total) by mouth daily with breakfast. 09/29/21  Yes Raylene Everts, MD  amoxicillin (AMOXIL) 875 MG tablet Take 1 tablet (875 mg total) by mouth 2 (two) times daily. 09/24/21   Raylene Everts, MD  fluconazole (DIFLUCAN) 150 MG tablet Take 1 tablet (150 mg total) by mouth daily. Repeat in 1 week if needed 09/24/21   Raylene Everts, MD  fluticasone [facility]) 50 MCG/ACT nasal spray Place 2 sprays into both nostrils daily. 09/24/21   Raylene Everts, MD    Family History Family History  Problem Relation Age of Onset   Hyperlipidemia Mother    Alcohol abuse Father    Lung cancer Maternal Aunt  Heart attack Maternal Grandfather    Colon cancer Paternal Grandmother     Social History Social History   Tobacco Use   Smoking status: Never   Smokeless tobacco: Never  Vaping Use   Vaping Use: Never used  Substance Use Topics   Alcohol use: Not Currently    Alcohol/week: 1.0 standard drink of alcohol    Types: 1 Standard drinks or equivalent per week   Drug use: No     Allergies   Nitrofurantoin   Review of Systems Review of Systems See HPI  Physical Exam Triage Vital Signs ED Triage Vitals  Enc Vitals Group     BP 09/29/21 1455 117/78     Pulse Rate 09/29/21 1455 96     Resp --      Temp 09/29/21 1455 98.9 F (37.2 C)     Temp Source 09/29/21 1455 Oral     SpO2 09/29/21 1455 98 %     Weight 09/29/21 1458 125 lb (56.7 kg)     Height 09/29/21 1458 '5\' 2"'$  (1.575 m)     Head Circumference --      Peak Flow --      Pain Score 09/29/21 1457 4     Pain Loc --      Pain Edu? --      Excl. in [place]? --    No data  found.  Updated Vital Signs BP 117/78 (BP Location: Left Arm)   Pulse 96   Temp 98.9 F (37.2 C) (Oral)   Ht '5\' 2"'$  (1.575 m)   Wt 56.7 kg   SpO2 98%   BMI 22.86 kg/m      Physical Exam Constitutional:      General: She is not in acute distress.    Appearance: Normal appearance. She is well-developed.  HENT:     Head: Normocephalic and atraumatic.     Right Ear: Tympanic membrane and ear canal normal.     Left Ear: Tympanic membrane and ear canal normal.     Nose: Congestion present.     Mouth/Throat:     Pharynx: Posterior oropharyngeal erythema present.     Comments: Right maxillary and ethmoid sinuses are tender Eyes:     Conjunctiva/sclera: Conjunctivae normal.     Pupils: Pupils are equal, round, and reactive to light.  Cardiovascular:     Rate and Rhythm: Normal rate.  Pulmonary:     Effort: Pulmonary effort is normal. No respiratory distress.  Abdominal:     General: There is no distension.     Palpations: Abdomen is soft.  Musculoskeletal:        General: Normal range of motion.     Cervical back: Normal range of motion.  Lymphadenopathy:     Cervical: Cervical adenopathy present.  Skin:    General: Skin is warm and dry.  Neurological:     Mental Status: She is alert.  Psychiatric:        Mood and Affect: Mood normal.        Behavior: Behavior normal.      UC Treatments / Results  Labs (all labs ordered are listed, but only abnormal results are displayed) Labs Reviewed - No data to display  EKG   Radiology No results found.  Procedures Procedures (including critical care time)  Medications Ordered in UC Medications - No data to display  Initial Impression / Assessment and Plan / UC Course  I have reviewed the triage vital signs and the nursing notes.  Pertinent labs & imaging results that were available during my care of the patient were reviewed by me and considered in my medical decision making (see chart for details).     We will  try adding prednisone.  Switching to doxycycline.  Pushing fluids.  See ENT if fails to improve Final Clinical Impressions(s) / UC Diagnoses   Final diagnoses:  Acute non-recurrent maxillary sinusitis     Discharge Instructions      Make sure you are drinking extra water Consider saline spray or rinse in your sinuses Stop the penicillin antibiotic Take doxycycline 2 times a day.  Be sure to take it with food Take prednisone once a day for 5 days Consider ENT if he fails to improve   ED Prescriptions     Medication Sig Dispense Auth. Provider   doxycycline (VIBRAMYCIN) 100 MG capsule Take 1 capsule (100 mg total) by mouth 2 (two) times daily. 14 capsule Raylene Everts, MD   predniSONE (DELTASONE) 20 MG tablet Take 2 tablets (40 mg total) by mouth daily with breakfast. 10 tablet Raylene Everts, MD      PDMP not reviewed this encounter.   Raylene Everts, MD 09/29/21 [id number]

## 2021-09-29 NOTE — Discharge Instructions (Signed)
Make sure you are drinking extra water Consider saline spray or rinse in your sinuses Stop the penicillin antibiotic Take doxycycline 2 times a day.  Be sure to take it with food Take prednisone once a day for 5 days Consider ENT if he fails to improve

## 2021-09-29 NOTE — ED Triage Notes (Addendum)
Patient c/o of cough and congestion onset for x5 days.  Patient was last seen 5 days ago reports not feeling any better

## 2021-10-06 ENCOUNTER — Telehealth: Payer: Self-pay

## 2021-10-06 NOTE — Telephone Encounter (Signed)
Telephone call from pt regarding reoccurring fevers x 2 days. Pt states she continues to have pain behind her rt eye. Pt states in the last two days she began having fevers with Tmax 100.4. pt states she becomes chilled and shaky when fevers spike. Pt states she has not been scheduled for ENT at this time. This nurse encouraged pt to return to clinic if concerned for reoccurring fevers and possible need for additional viral testing. Pt verbalized understanding.

## 2021-10-09 ENCOUNTER — Ambulatory Visit (INDEPENDENT_AMBULATORY_CARE_PROVIDER_SITE_OTHER): Payer: 59 | Admitting: Family Medicine

## 2021-10-09 ENCOUNTER — Encounter: Payer: Self-pay | Admitting: Family Medicine

## 2021-10-09 VITALS — BP 106/71 | HR 125 | Temp 98.2°F | Ht 62.0 in | Wt 129.0 lb

## 2021-10-09 DIAGNOSIS — Z09 Encounter for follow-up examination after completed treatment for conditions other than malignant neoplasm: Secondary | ICD-10-CM | POA: Diagnosis not present

## 2021-10-09 DIAGNOSIS — R509 Fever, unspecified: Secondary | ICD-10-CM

## 2021-10-09 NOTE — Assessment & Plan Note (Signed)
-   pt much improved

## 2021-10-09 NOTE — Progress Notes (Signed)
   Acute Office Visit  Subjective:     Patient ID: Jacqueline Huerta, female    DOB: [birth year], 41 y.o.   MRN: [id number]  Chief Complaint  Patient presents with   Establish Care    HPI Patient is in today for fever of unknown origin. She was recently seen at [facility] ED three days ago and prior to this was seen in Urgent care 2 weeks ago and given a course of amoxicillin and doxycycline. Per Epic review, her family was sick at the time with a sinus infection but is still experiencing fevers >100.4. She tested negative for flu and covid. UA was also negative. She had testing done for Lyme, erlichiosis, and Rocky mtn spotted fever. CXR was negative. CT head was negative. CT abd/pelvis was negative. Mono negative. Fever stopped yesterday and she has been fever free for 24 hours.   Sinus infection has resolved.   Review of Systems  Constitutional:  Negative for chills and fever.  Respiratory:  Negative for cough and shortness of breath.   Cardiovascular:  Negative for chest pain.  Neurological:  Negative for headaches.       Objective:    BP 106/71   Pulse (!) 125   Temp 98.2 F (36.8 C)   Ht '5\' 2"'$  (1.575 m)   Wt 129 lb (58.5 kg)   SpO2 100%   BMI 23.59 kg/m    Physical Exam Vitals and nursing note reviewed.  Constitutional:      General: She is not in acute distress.    Appearance: Normal appearance.  HENT:     Head: Normocephalic and atraumatic.     Right Ear: External ear normal.     Left Ear: External ear normal.     Nose: Nose normal.  Eyes:     Conjunctiva/sclera: Conjunctivae normal.  Cardiovascular:     Rate and Rhythm: Normal rate and regular rhythm.  Pulmonary:     Effort: Pulmonary effort is normal.     Breath sounds: Normal breath sounds.  Neurological:     General: No focal deficit present.     Mental Status: She is alert and oriented to person, place, and time.  Psychiatric:        Mood and Affect: Mood normal.        Behavior: Behavior  normal.        Thought Content: Thought content normal.        Judgment: Judgment normal.     No results found for any visits on 10/09/21.      Assessment & Plan:   Problem List Items Addressed This Visit       Other   Fever    - resolved per patient - she has been fever free for 24 hours  - discussed that if she gets a fever again to let us know and I can add a CMV test to the order - lyme, erlichiosis, and rocky mtn spotted fever is still pending        Need for follow-up care after discharge - Primary    - pt much improved       No orders of the defined types were placed in this encounter.   Return in about 2 weeks (around 10/23/2021) for annual exam.  Owens Loffler, DO

## 2021-10-09 NOTE — Assessment & Plan Note (Addendum)
-   resolved per patient - she has been fever free for 24 hours  - discussed that if she gets a fever again to let us know and I can add a CMV test to the order - lyme, erlichiosis, and rocky mtn spotted fever is still pending

## 2022-01-23 ENCOUNTER — Ambulatory Visit: Payer: 59 | Admitting: Family Medicine

## 2022-01-29 ENCOUNTER — Ambulatory Visit: Payer: 59 | Admitting: Family Medicine

## 2022-02-07 ENCOUNTER — Ambulatory Visit: Payer: 59 | Admitting: Physician Assistant

## 2022-02-20 ENCOUNTER — Ambulatory Visit (INDEPENDENT_AMBULATORY_CARE_PROVIDER_SITE_OTHER): Payer: 59 | Admitting: Family Medicine

## 2022-02-20 ENCOUNTER — Ambulatory Visit: Payer: 59 | Admitting: Family Medicine

## 2022-02-20 ENCOUNTER — Encounter: Payer: Self-pay | Admitting: Family Medicine

## 2022-02-20 VITALS — BP 98/64 | HR 64 | Temp 97.9°F | Ht 62.0 in | Wt 134.4 lb

## 2022-02-20 DIAGNOSIS — R5382 Chronic fatigue, unspecified: Secondary | ICD-10-CM

## 2022-02-20 DIAGNOSIS — Z13220 Encounter for screening for lipoid disorders: Secondary | ICD-10-CM

## 2022-02-20 DIAGNOSIS — Z136 Encounter for screening for cardiovascular disorders: Secondary | ICD-10-CM

## 2022-02-20 DIAGNOSIS — Z0000 Encounter for general adult medical examination without abnormal findings: Secondary | ICD-10-CM

## 2022-02-20 DIAGNOSIS — R7302 Impaired glucose tolerance (oral): Secondary | ICD-10-CM

## 2022-02-20 DIAGNOSIS — Z124 Encounter for screening for malignant neoplasm of cervix: Secondary | ICD-10-CM

## 2022-02-20 NOTE — Progress Notes (Signed)
Complete physical exam  Patient: Jacqueline Huerta   DOB: [birth year]   41 y.o. Female  MRN: [id number]  Subjective:    Chief Complaint  Patient presents with   pap smear and blood work     Patient in office for pap smear and blood work - provider scheduled with at [facility] had already left for the day. Patient does not have any c/o    Annual Exam    Jacqueline Huerta is a 41 y.o. female who presents today for a complete physical exam. She reports consuming a general diet. The patient does not participate in regular exercise at present. She generally feels well. She reports sleeping well. She does not have additional problems to discuss today.  Pt has 73  year old daughter, received Tdap at that time.  No family hx of breast cancer. She has hx of breast implants in 2004.  Most recent fall risk assessment:     No data to display           Most recent depression screenings:    10/09/2021    1:57 PM 10/09/2021    1:56 PM  PHQ 2/9 Scores  PHQ - 2 Score 0 0  PHQ- 9 Score 3     Vision:Within last year  Patient Active Problem List   Diagnosis Date Noted   Fever 10/09/2021   Need for follow-up care after discharge 10/09/2021   History of cesarean section 11/10/2019   Maternal care due to low transverse uterine scar from previous cesarean delivery 11/09/2019   Epigastric pain 04/27/2018   Nausea without vomiting 04/27/2018   Tachycardia with heart rate 100-120 beats per minute 04/27/2018   Pelvic pain in female 04/21/2018   Menstrual cycle disorder 05/29/2017   History of bilateral tubal ligation 05/29/2017   Family history of colon cancer 04/17/2017   Atypical nevus of back 04/17/2017   History of abnormal cervical Pap smear    Breast tenderness in female    Past Medical History:  Diagnosis Date   Anxiety    Breast tenderness in female    Dysplastic nevus    History of abnormal cervical Pap smear    Hypertension       Patient Care Team: Owens Loffler, DO  as PCP - General (Family Medicine)   No outpatient medications prior to visit.   No facility-administered medications prior to visit.    Review of Systems  All other systems reviewed and are negative.         Objective:     BP 98/64   Pulse 64   Temp 97.9 F (36.6 C)   Ht '5\' 2"'$  (1.575 m)   Wt 134 lb 6 oz (61 kg)   SpO2 100%   BMI 24.58 kg/m     Physical Exam Vitals and nursing note reviewed.  Constitutional:      Appearance: Normal appearance. She is normal weight.  HENT:     Head: Normocephalic and atraumatic.     Right Ear: Tympanic membrane, ear canal and external ear normal.     Left Ear: Tympanic membrane, ear canal and external ear normal.     Nose: Nose normal.     Mouth/Throat:     Mouth: Mucous membranes are moist.     Pharynx: Oropharynx is clear.  Eyes:     Conjunctiva/sclera: Conjunctivae normal.     Pupils: Pupils are equal, round, and reactive to light.  Cardiovascular:     Rate and Rhythm:  Normal rate and regular rhythm.     Pulses: Normal pulses.     Heart sounds: Normal heart sounds.     Comments: Breast: implants present, no palpable masses or lesions Pulmonary:     Effort: Pulmonary effort is normal.     Breath sounds: Normal breath sounds.  Abdominal:     General: Abdomen is flat. Bowel sounds are normal.     Palpations: Abdomen is soft.  Genitourinary:    General: Normal vulva.     Vagina: No vaginal discharge.     Rectum: Normal.  Skin:    General: Skin is warm.     Capillary Refill: Capillary refill takes less than 2 seconds.  Neurological:     General: No focal deficit present.     Mental Status: She is alert and oriented to person, place, and time. Mental status is at baseline.  Psychiatric:        Mood and Affect: Mood normal.        Behavior: Behavior normal.        Thought Content: Thought content normal.        Judgment: Judgment normal.      No results found for any visits on 02/20/22.      Assessment & Plan:     Routine Health Maintenance and Physical Exam  Immunization History  Administered Date(s) Administered   Influenza Inj Mdck Quad Pf 04/06/2016   Tdap 10/27/2019    Health Maintenance  Topic Date Due   Hepatitis C Screening  Never done   INFLUENZA VACCINE  05/25/2022 (Originally 09/24/2021)   PAP SMEAR-Modifier  05/30/2022   DTaP/Tdap/Td (2 - Td or Tdap) 10/26/2029   HPV VACCINES  Aged Out    Discussed health benefits of physical activity, and encouraged her to engage in regular exercise appropriate for her age and condition.  Problem List Items Addressed This Visit   None Visit Diagnoses     Annual physical exam    -  Primary   Impaired glucose tolerance       Relevant Orders   CBC with Differential/Platelet   Comprehensive metabolic panel   Hemoglobin A1c   Encounter for lipid screening for cardiovascular disease       Relevant Orders   Lipid panel   Screening for cervical cancer       Relevant Orders   IGP, Aptima HPV   Chronic fatigue       Relevant Orders   TSH   T4, free      Return in about 1 year (around 02/21/2023) for Annual Physical. Screening labs and pap smear with HPV today See in 1 year sooner prn.    Jacqueline Rio, MD

## 2022-02-21 LAB — LIPID PANEL
Chol/HDL Ratio: 2.2 ratio (ref 0.0–4.4)
Cholesterol, Total: 123 mg/dL (ref 100–199)
HDL: 55 mg/dL (ref >39)
LDL Chol Calc (NIH): 51 mg/dL (ref 0–99)
Triglycerides: 89 mg/dL (ref 0–149)
VLDL Cholesterol Cal: 17 mg/dL (ref 5–40)

## 2022-02-21 LAB — CBC WITH DIFFERENTIAL/PLATELET
Basophils Absolute: 0 10*3/uL (ref 0.0–0.2)
Basos: 1 %
EOS (ABSOLUTE): 0.3 10*3/uL (ref 0.0–0.4)
Eos: 4 %
Hematocrit: 44.1 % (ref 34.0–46.6)
Hemoglobin: 14.4 g/dL (ref 11.1–15.9)
Immature Grans (Abs): 0 10*3/uL (ref 0.0–0.1)
Immature Granulocytes: 0 %
Lymphocytes Absolute: 1.1 10*3/uL (ref 0.7–3.1)
Lymphs: 16 %
MCH: 29.6 pg (ref 26.6–33.0)
MCHC: 32.7 g/dL (ref 31.5–35.7)
MCV: 91 fL (ref 79–97)
Monocytes Absolute: 0.8 10*3/uL (ref 0.1–0.9)
Monocytes: 12 %
Neutrophils Absolute: 4.8 10*3/uL (ref 1.4–7.0)
Neutrophils: 67 %
Platelets: 197 10*3/uL (ref 150–450)
RBC: 4.86 x10E6/uL (ref 3.77–5.28)
RDW: 11.9 % (ref 11.7–15.4)
WBC: 7 10*3/uL (ref 3.4–10.8)

## 2022-02-21 LAB — COMPREHENSIVE METABOLIC PANEL
ALT: 12 IU/L (ref 0–32)
AST: 13 IU/L (ref 0–40)
Albumin/Globulin Ratio: 1.8 (ref 1.2–2.2)
Albumin: 4.5 g/dL (ref 3.9–4.9)
Alkaline Phosphatase: 72 IU/L (ref 44–121)
BUN/Creatinine Ratio: 13 (ref 9–23)
BUN: 9 mg/dL (ref 6–20)
Bilirubin Total: 0.3 mg/dL (ref 0.0–1.2)
CO2: 23 mmol/L (ref 20–29)
Calcium: 8.9 mg/dL (ref 8.7–10.2)
Chloride: 103 mmol/L (ref 96–106)
Creatinine, Ser: 0.71 mg/dL (ref 0.57–1.00)
Globulin, Total: 2.5 g/dL (ref 1.5–4.5)
Glucose: 68 mg/dL — ABNORMAL LOW (ref 70–99)
Potassium: 3.8 mmol/L (ref 3.5–5.2)
Sodium: 141 mmol/L (ref 134–144)
Total Protein: 7 g/dL (ref 6.0–8.5)
eGFR: 111 mL/min/{1.73_m2} (ref >59)

## 2022-02-21 LAB — T4, FREE: Free T4: 1.18 ng/dL (ref 0.82–1.77)

## 2022-02-21 LAB — HEMOGLOBIN A1C
Est. average glucose Bld gHb Est-mCnc: 108 mg/dL
Hgb A1c MFr Bld: 5.4 % (ref 4.8–5.6)

## 2022-02-21 LAB — TSH: TSH: 1.03 u[IU]/mL (ref 0.450–4.500)

## 2022-02-26 LAB — IGP, APTIMA HPV
HPV Aptima: NEGATIVE
PAP Smear Comment: 0

## 2022-03-25 ENCOUNTER — Encounter: Payer: Self-pay | Admitting: Family Medicine

## 2022-03-25 ENCOUNTER — Ambulatory Visit (INDEPENDENT_AMBULATORY_CARE_PROVIDER_SITE_OTHER): Payer: 59 | Admitting: Family Medicine

## 2022-03-25 VITALS — BP 104/65 | HR 86 | Ht 62.0 in | Wt 133.3 lb

## 2022-03-25 DIAGNOSIS — J029 Acute pharyngitis, unspecified: Secondary | ICD-10-CM

## 2022-03-25 DIAGNOSIS — J020 Streptococcal pharyngitis: Secondary | ICD-10-CM

## 2022-03-25 LAB — POCT RAPID STREP A (OFFICE): Rapid Strep A Screen: POSITIVE — AB

## 2022-03-25 MED ORDER — AMOXICILLIN 500 MG PO TABS: 500.0000 mg | ORAL_TABLET | Freq: Two times a day (BID) | ORAL | 0 refills | Status: DC

## 2022-03-25 NOTE — Progress Notes (Signed)
Subjective:     Jacqueline Huerta is a 42 y.o. female who presents for evaluation of sore throat. Associated symptoms include sore throat. Onset of symptoms was 3 days ago, and have been unchanged since that time. She is drinking plenty of fluids. She has not had a recent close exposure to someone with proven streptococcal pharyngitis. Stay at home with 2 children at home. None seem to be sick. Also having associated body aches and low grade fever of 99. Taking OTC advil with no relief of sore throat.  The following portions of the patient's history were reviewed and updated as appropriate: allergies, current medications, and past medical history.  Review of Systems Pertinent items are noted in HPI.    Objective:    BP 104/65   Pulse 86   Ht '5\' 2"'$  (1.575 m)   Wt 133 lb 4.8 oz (60.5 kg)   SpO2 99%   BMI 24.38 kg/m  General appearance: alert, appears stated age, and no distress Head: Normocephalic, without obvious abnormality, atraumatic Eyes: conjunctivae/corneas clear. PERRL, EOM's intact. Fundi benign. Ears: normal TM's and external ear canals both ears Nose: Nares normal. Septum midline. Mucosa normal. No drainage or sinus tenderness. Throat: abnormal findings: exudates present and moderate oropharyngeal erythema Neck: mild anterior cervical adenopathy, no carotid bruit, and no JVD Lungs: clear to auscultation bilaterally and normal percussion bilaterally Heart: regular rate and rhythm, S1, S2 normal, no murmur, click, rub or gallop Pulses: 2+ and symmetric  Laboratory Strep test done. Results:positive.    Assessment:    Acute pharyngitis, likely  Strep throat.   [name] was seen today for cough.  Diagnoses and all orders for this visit:  Sore throat -     Cancel: POC COVID-19 BinaxNow -     Cancel: POCT Influenza A/B -     POCT rapid strep A  Strep pharyngitis -     amoxicillin (AMOXIL) 500 MG tablet; Take 1 tablet (500 mg total) by mouth 2 (two) times daily  for 10 days.    Plan:    Patient placed on antibiotics. Use of OTC analgesics recommended as well as salt water gargles. Follow up as needed.   Pt advised to do warm salt water gargles, continue Advil prn for discomfort and fever, chloraseptic spray and throat lozenges prn for sore throat.

## 2022-03-26 ENCOUNTER — Telehealth: Payer: Self-pay | Admitting: Family Medicine

## 2022-03-26 DIAGNOSIS — J020 Streptococcal pharyngitis: Secondary | ICD-10-CM

## 2022-03-26 MED ORDER — CEPHALEXIN 500 MG PO CAPS: 500.0000 mg | ORAL_CAPSULE | Freq: Two times a day (BID) | ORAL | 0 refills | Status: AC

## 2022-03-26 NOTE — Telephone Encounter (Signed)
Patient called and left voicemail at closing yesterday that Amoxicillian prescribed for diagnosis is making her feel dizzy. Patient requested a differing prescription if possible. Jacqueline Huerta

## 2022-03-26 NOTE — Telephone Encounter (Signed)
Patient called and made aware of new prescription sent in. -Buel Ream

## 2022-03-26 NOTE — Telephone Encounter (Signed)
Keflex sent to pharmacy to take the place of Amoxicillin

## 2022-04-11 ENCOUNTER — Ambulatory Visit (INDEPENDENT_AMBULATORY_CARE_PROVIDER_SITE_OTHER): Payer: 59 | Admitting: Family Medicine

## 2022-04-11 ENCOUNTER — Encounter: Payer: Self-pay | Admitting: Family Medicine

## 2022-04-11 VITALS — BP 96/62 | HR 95 | Temp 98.2°F | Ht 62.0 in | Wt 131.2 lb

## 2022-04-11 DIAGNOSIS — R748 Abnormal levels of other serum enzymes: Secondary | ICD-10-CM | POA: Insufficient documentation

## 2022-04-11 DIAGNOSIS — L659 Nonscarring hair loss, unspecified: Secondary | ICD-10-CM | POA: Diagnosis not present

## 2022-04-11 DIAGNOSIS — N926 Irregular menstruation, unspecified: Secondary | ICD-10-CM | POA: Diagnosis not present

## 2022-04-11 DIAGNOSIS — R1011 Right upper quadrant pain: Secondary | ICD-10-CM | POA: Insufficient documentation

## 2022-04-11 DIAGNOSIS — K219 Gastro-esophageal reflux disease without esophagitis: Secondary | ICD-10-CM | POA: Insufficient documentation

## 2022-04-11 DIAGNOSIS — K5900 Constipation, unspecified: Secondary | ICD-10-CM | POA: Insufficient documentation

## 2022-04-11 DIAGNOSIS — Z800 Family history of malignant neoplasm of digestive organs: Secondary | ICD-10-CM | POA: Insufficient documentation

## 2022-04-11 NOTE — Progress Notes (Signed)
Acute Office Visit  Subjective:     Patient ID: Jacqueline Huerta, female    DOB: Jun 02, 1980, 42 y.o.   MRN: [id number]  Chief Complaint  Patient presents with   Alopecia    Patient c/o some hair loss  and dizziness- x  worse x 2 mths - also mentioned history of  heart palpitations and racing heart- fatigue,- weakened immune system to help with evaluation- she states hair loss is in  some "clumps" lost and  brittle ends- requesting blood work - wondering if hormonal after tubial ligation  by clamping x 7/1 /11( patient states completley "cut out " at this time - x 11/09/19     HPI Patient is in today for acute visit.  Pt reports she's always had thick hair until she had tubal ligation. She is having thinning hair. She also reports she has had irregular menses for the last year. She says she also spots in between her cycles.  She also reports drying of her skin. She has no issues with her nails. She does reports insomnia and issues with falling asleep.    Review of Systems  Skin:        Hair loss  Psychiatric/Behavioral:  The patient is nervous/anxious and has insomnia.   All other systems reviewed and are negative.       Objective:    BP 96/62   Pulse 95   Temp 98.2 F (36.8 C)   Ht 5' 2"$  (1.575 m)   Wt 131 lb 4 oz (59.5 kg)   SpO2 99%   BMI 24.01 kg/m    Physical Exam Vitals and nursing note reviewed.  Constitutional:      Appearance: Normal appearance. She is normal weight.  HENT:     Head: Normocephalic and atraumatic.     Right Ear: External ear normal.     Left Ear: External ear normal.  Cardiovascular:     Rate and Rhythm: Normal rate.  Pulmonary:     Effort: Pulmonary effort is normal.  Neurological:     General: No focal deficit present.     Mental Status: She is alert and oriented to person, place, and time. Mental status is at baseline.  Psychiatric:        Mood and Affect: Mood normal.        Behavior: Behavior normal.        Thought  Content: Thought content normal.        Judgment: Judgment normal.    No results found for any visits on 04/11/22.      Assessment & Plan:   Problem List Items Addressed This Visit   None Visit Diagnoses     Alopecia    -  Primary   Relevant Orders   TSH   T4, free   Testosterone, Free, Total, SHBG   VITAMIN D 25 Hydroxy (Vit-D Deficiency, Fractures)   Vitamin B12   ANA   DHEA-sulfate   Irregular menses       Relevant Orders   TSH   T4, free   Testosterone, Free, Total, SHBG   VITAMIN D 25 Hydroxy (Vit-D Deficiency, Fractures)   Vitamin B12   ANA   DHEA-sulfate      Screening labs for hormonal imbalance. Advised pt to add biotin for supplementation Pt also has hx of anxiety and stress but feels like she's handling it well without medicine.  She gets hair done and highlighted but reports last time was in October.  No orders of the defined types were placed in this encounter.   No follow-ups on file.  Leeanne Rio, MD

## 2022-04-19 LAB — TSH: TSH: 1.12 u[IU]/mL (ref 0.450–4.500)

## 2022-04-19 LAB — VITAMIN D 25 HYDROXY (VIT D DEFICIENCY, FRACTURES): Vit D, 25-Hydroxy: 25.1 ng/mL — ABNORMAL LOW (ref 30.0–100.0)

## 2022-04-19 LAB — TESTOSTERONE, FREE, TOTAL, SHBG
Sex Hormone Binding: 58.6 nmol/L (ref 24.6–122.0)
Testosterone, Free: 2.5 pg/mL (ref 0.0–4.2)
Testosterone: 36 ng/dL (ref 8–60)

## 2022-04-19 LAB — VITAMIN B12: Vitamin B-12: 552 pg/mL (ref 232–1245)

## 2022-04-19 LAB — ANA: Anti Nuclear Antibody (ANA): POSITIVE — AB

## 2022-04-19 LAB — T4, FREE: Free T4: 1.14 ng/dL (ref 0.82–1.77)

## 2022-04-19 LAB — DHEA-SULFATE: DHEA-SO4: 317 ug/dL — ABNORMAL HIGH (ref 57.3–279.2)

## 2022-04-21 ENCOUNTER — Other Ambulatory Visit: Payer: Self-pay | Admitting: Family Medicine

## 2022-04-21 ENCOUNTER — Encounter: Payer: Self-pay | Admitting: Family Medicine

## 2022-04-21 DIAGNOSIS — E559 Vitamin D deficiency, unspecified: Secondary | ICD-10-CM

## 2022-04-21 DIAGNOSIS — R768 Other specified abnormal immunological findings in serum: Secondary | ICD-10-CM

## 2022-04-21 MED ORDER — VITAMIN D (ERGOCALCIFEROL) 1.25 MG (50000 UNIT) PO CAPS: 50000.0000 [IU] | ORAL_CAPSULE | ORAL | 1 refills | Status: DC

## 2022-04-22 ENCOUNTER — Ambulatory Visit: Payer: 59 | Admitting: Family Medicine

## 2022-04-23 ENCOUNTER — Other Ambulatory Visit: Payer: Self-pay | Admitting: Family Medicine

## 2022-04-23 ENCOUNTER — Ambulatory Visit: Payer: 59

## 2022-04-23 ENCOUNTER — Encounter: Payer: Self-pay | Admitting: Family Medicine

## 2022-04-23 DIAGNOSIS — R768 Other specified abnormal immunological findings in serum: Secondary | ICD-10-CM

## 2022-04-23 DIAGNOSIS — L659 Nonscarring hair loss, unspecified: Secondary | ICD-10-CM

## 2022-04-23 DIAGNOSIS — N926 Irregular menstruation, unspecified: Secondary | ICD-10-CM

## 2022-04-23 NOTE — Addendum Note (Signed)
Addended by: Leeanne Rio on: 04/23/2022 01:49 PM   Modules accepted: Orders

## 2022-04-24 ENCOUNTER — Encounter: Payer: Self-pay | Admitting: Family Medicine

## 2022-04-24 LAB — HISTONE ANTIBODIES, IGG, BLOOD: Histone Ab: 0.4 Units (ref 0.0–0.9)

## 2022-04-24 LAB — SJOGREN'S SYNDROME ANTIBODS(SSA + SSB)
ENA SSA (RO) Ab: 0.2 AI (ref 0.0–0.9)
ENA SSB (LA) Ab: <0.2 AI (ref 0.0–0.9)

## 2022-04-24 LAB — ANTI-DNA ANTIBODY, DOUBLE-STRANDED: dsDNA Ab: 44 IU/mL — ABNORMAL HIGH (ref 0–9)

## 2022-05-01 ENCOUNTER — Telehealth: Payer: Self-pay | Admitting: Family Medicine

## 2022-05-01 DIAGNOSIS — R768 Other specified abnormal immunological findings in serum: Secondary | ICD-10-CM

## 2022-05-01 DIAGNOSIS — L659 Nonscarring hair loss, unspecified: Secondary | ICD-10-CM

## 2022-05-01 NOTE — Telephone Encounter (Signed)
Referral, clinical notes, lab results and copies of insurance cards have been faxed to [place] at [phone]. Office will contact patient to schedule referral appointment.

## 2022-05-01 NOTE — Addendum Note (Signed)
Addended by: Leeanne Rio on: 05/01/2022 09:15 AM   Modules accepted: Orders

## 2022-05-01 NOTE — Telephone Encounter (Signed)
Patient called to state that the referral for rheumatology went through but that when the location contacted her, they notified her that they are scheduling 6 months out. Patient would like to be sent to a location scheduling much sooner if available. Jacqueline Huerta

## 2022-05-01 NOTE — Telephone Encounter (Signed)
Message sent to Dr. Huel Cote for additional order to send to [facility].

## 2022-05-01 NOTE — Telephone Encounter (Signed)
Referral, clinical notes and lab results faxed to [facility].

## 2022-05-01 NOTE — Addendum Note (Signed)
Addended by: Leeanne Rio on: 05/01/2022 11:02 AM   Modules accepted: Orders

## 2022-05-01 NOTE — Telephone Encounter (Signed)
Previous referral canceled. New referral placed for [facility].

## 2022-05-01 NOTE — Telephone Encounter (Signed)
Referral placed to Rheumatology. Please send with lab results from February 2024

## 2022-05-01 NOTE — Telephone Encounter (Signed)
Patient called to state that she was supposed to be sent to a rheumatologist a week ago via referral but has not heard anything back. Referral appears to have not been placed from front desk end. Patient wants to be updated at [phone]. Please advise.

## 2022-05-12 ENCOUNTER — Ambulatory Visit: Payer: 59 | Admitting: Family Medicine

## 2022-05-19 ENCOUNTER — Encounter: Payer: Self-pay | Admitting: Family Medicine

## 2022-05-19 ENCOUNTER — Ambulatory Visit (INDEPENDENT_AMBULATORY_CARE_PROVIDER_SITE_OTHER): Payer: 59 | Admitting: Family Medicine

## 2022-05-19 ENCOUNTER — Ambulatory Visit: Payer: 59 | Attending: Family Medicine

## 2022-05-19 VITALS — BP 124/94 | HR 112 | Ht 62.0 in | Wt 133.0 lb

## 2022-05-19 DIAGNOSIS — K219 Gastro-esophageal reflux disease without esophagitis: Secondary | ICD-10-CM

## 2022-05-19 DIAGNOSIS — N926 Irregular menstruation, unspecified: Secondary | ICD-10-CM

## 2022-05-19 DIAGNOSIS — R5382 Chronic fatigue, unspecified: Secondary | ICD-10-CM

## 2022-05-19 DIAGNOSIS — R768 Other specified abnormal immunological findings in serum: Secondary | ICD-10-CM

## 2022-05-19 DIAGNOSIS — R002 Palpitations: Secondary | ICD-10-CM

## 2022-05-19 DIAGNOSIS — F411 Generalized anxiety disorder: Secondary | ICD-10-CM | POA: Insufficient documentation

## 2022-05-19 MED ORDER — ESCITALOPRAM OXALATE 10 MG PO TABS: 10.0000 mg | ORAL_TABLET | Freq: Every day | ORAL | 3 refills | Status: DC

## 2022-05-19 MED ORDER — PANTOPRAZOLE SODIUM 40 MG PO TBEC: 40.0000 mg | DELAYED_RELEASE_TABLET | Freq: Every day | ORAL | 3 refills | Status: DC

## 2022-05-19 NOTE — Assessment & Plan Note (Signed)
Upon lab review, patient has a positive ANA as well as positive double-stranded DNA.  It is unclear what the ANA titer is so I have repeated this blood work.  Patient was negative for drug-induced lupus.  Also negative for Sjogren's syndrome -I am wondering if this is related to her history of breast implants

## 2022-05-19 NOTE — Assessment & Plan Note (Signed)
-   Discussed feelings of anxiety today.  Patient believes she does need treatment because of her stress level was trying to get a diagnosis.  She is open to medication today.  We will go ahead and start her on Lexapro 10 mg.  Patient says she is sensitive to medication.  Will have her go ahead and break the 10 mg into halves and take 5 for 10 days before increasing -Have patient follow-up in 4 weeks to discuss efficacy

## 2022-05-19 NOTE — Assessment & Plan Note (Signed)
Patient admits to fast heartbeat.  Sister does have SVT - Have ordered Zio patch monitoring - We also discussed decreasing caffeine intake - It is possible that anxiety about an unknown diagnosis is causing palpitations.

## 2022-05-19 NOTE — Progress Notes (Signed)
Established patient visit   Patient: Jacqueline Huerta   DOB: 20-Apr-1980   42 y.o. Female  MRN: [id number] Visit Date: 05/19/2022  Today's healthcare provider: Owens Loffler, DO   Chief Complaint  Patient presents with   Follow-up    SUBJECTIVE    Chief Complaint  Patient presents with   Follow-up   HPI   Pt presents for follow up.  She was previously seen for strep and during this visit other blood work was added due to irregular menses.  She was positive for ANA and dsDNA antibody.  She was sent to rheumatology at this time.  Today, patient is concerned about her hair falling out.  She does dye her hair but says that she has been going to the same place for many years and denies any chance of chemical breakage.  She notes that her hair will split halfway down the hair follicle.  She did see dermatology and was given Diflucan to take every other day for this, but she has not noticed a difference.  She is taking biotin supplements.  Patient did follow with rheumatology for positive ANA and double-stranded DNA however she said that rheumatology was not convinced it was lupus.  Patient reports an increase in anxiety with this as well as epigastric pain.  She describes epigastric pain as a burning sensation that was worse when she laid down to go to sleep last night.  Patient is also reporting numbness and tingling in legs bilaterally.  Patient also claims to have palpitations.  She does have a sister that has SVT.    Review of Systems  Constitutional:  Positive for fatigue.  Respiratory:  Negative for cough.   Cardiovascular:  Negative for chest pain.  Gastrointestinal:  Positive for abdominal pain.  Psychiatric/Behavioral:  The patient is nervous/anxious.        Current Meds  Medication Sig   escitalopram (LEXAPRO) 10 MG tablet Take 1 tablet (10 mg total) by mouth daily.   pantoprazole (PROTONIX) 40 MG tablet Take 1 tablet (40 mg total) by mouth daily.     OBJECTIVE    BP (!) 124/94   Pulse (!) 112   Ht 5\' 2"  (1.575 m)   Wt 133 lb (60.3 kg)   SpO2 99%   BMI 24.33 kg/m   Physical Exam Vitals and nursing note reviewed.  Constitutional:      General: She is not in acute distress.    Appearance: Normal appearance.  HENT:     Head: Normocephalic and atraumatic.     Right Ear: External ear normal.     Left Ear: External ear normal.     Nose: Nose normal.  Eyes:     Conjunctiva/sclera: Conjunctivae normal.  Cardiovascular:     Rate and Rhythm: Normal rate and regular rhythm.  Pulmonary:     Effort: Pulmonary effort is normal.     Breath sounds: Normal breath sounds.  Neurological:     General: No focal deficit present.     Mental Status: She is alert and oriented to person, place, and time.  Psychiatric:        Mood and Affect: Mood normal.        Behavior: Behavior normal.        Thought Content: Thought content normal.        Judgment: Judgment normal.          ASSESSMENT & PLAN    Problem List Items Addressed This Visit  Digestive   Gastroesophageal reflux disease    - Epigastric pain along with stress and a burning sensation sounds like reflux versus stress ulcer.  I have gone ahead and started patient on Protonix and we will follow-up in 2 weeks to check efficacy.      Relevant Medications   pantoprazole (PROTONIX) 40 MG tablet     Other   Palpitations - Primary    Patient admits to fast heartbeat.  Sister does have SVT - Have ordered Zio patch monitoring - We also discussed decreasing caffeine intake - It is possible that anxiety about an unknown diagnosis is causing palpitations.      Relevant Orders   LONG TERM MONITOR (3-14 DAYS)   Generalized anxiety disorder    - Discussed feelings of anxiety today.  Patient believes she does need treatment because of her stress level was trying to get a diagnosis.  She is open to medication today.  We will go ahead and start her on Lexapro 10 mg.  Patient  says she is sensitive to medication.  Will have her go ahead and break the 10 mg into halves and take 5 for 10 days before increasing -Have patient follow-up in 4 weeks to discuss efficacy      Relevant Medications   escitalopram (LEXAPRO) 10 MG tablet   Other Relevant Orders   TSH+T4F+T3Free   Positive ANA (antinuclear antibody)    - Have gone ahead and reordered ANA panel to see if we can get a titer on the ANA       Relevant Orders   ANA,IFA RA Diag Pnl w/rflx Tit/Patn   Chronic fatigue    Upon lab review, patient has a positive ANA as well as positive double-stranded DNA.  It is unclear what the ANA titer is so I have repeated this blood work.  Patient was negative for drug-induced lupus.  Also negative for Sjogren's syndrome -I am wondering if this is related to her history of breast implants      Other Visit Diagnoses     Irregular menses       Relevant Orders   Ambulatory referral to Gynecology       Return in about 4 weeks (around 06/16/2022).      Meds ordered this encounter  Medications   escitalopram (LEXAPRO) 10 MG tablet    Sig: Take 1 tablet (10 mg total) by mouth daily.    Dispense:  30 tablet    Refill:  3   pantoprazole (PROTONIX) 40 MG tablet    Sig: Take 1 tablet (40 mg total) by mouth daily.    Dispense:  30 tablet    Refill:  3    Orders Placed This Encounter  Procedures   TSH+T4F+T3Free   ANA,IFA RA Diag Pnl w/rflx Tit/Patn   Ambulatory referral to Gynecology    Referral Priority:   Urgent    Referral Type:   Consultation    Referral Reason:   Specialty Services Required    Requested Specialty:   Gynecology    Number of Visits Requested:   1   LONG TERM MONITOR (3-14 DAYS)    Standing Status:   Future    Number of Occurrences:   1    Standing Expiration Date:   05/19/2023    Order Specific Question:   Where should this test be performed?    Answer:   [facility] ([facility])    Order Specific Question:   Does the patient have an implanted  cardiac device?    Answer:   No    Order Specific Question:   Prescribed days of wear    Answer:   3    Order Specific Question:   Type of enrollment    Answer:   Home Enrollment    Order Specific Question:   Vendor:    Answer:   [employer]     Owens Loffler, [place] at [facility] [phone] (phone) [phone] (fax)  [place]

## 2022-05-19 NOTE — Assessment & Plan Note (Signed)
-   Epigastric pain along with stress and a burning sensation sounds like reflux versus stress ulcer.  I have gone ahead and started patient on Protonix and we will follow-up in 2 weeks to check efficacy.

## 2022-05-19 NOTE — Progress Notes (Unsigned)
Enrolled for [employer] to mail a ZIO XT long term holter monitor to the patients address on file.   DOD to read. 

## 2022-05-19 NOTE — Patient Instructions (Signed)
Call rheumatology about blood work   Get repeated blood work at [facility] try protonix for stomach pain   Stop vit d for now and increase yogurt, cheese  Start taking lexapro (escitalopram) 10mg  daily   Follow up in 4 weeks via [employer]

## 2022-05-19 NOTE — Assessment & Plan Note (Addendum)
-   Have gone ahead and reordered ANA panel to see if we can get a titer on the ANA

## 2022-05-21 LAB — TSH+T4F+T3FREE
Free T4: 1.35 ng/dL (ref 0.82–1.77)
T3, Free: 3.7 pg/mL (ref 2.0–4.4)
TSH: 1.84 u[IU]/mL (ref 0.450–4.500)

## 2022-05-21 LAB — ANA,IFA RA DIAG PNL W/RFLX TIT/PATN
ANA Titer 1: NEGATIVE
Cyclic Citrullin Peptide Ab: 6 units (ref 0–19)
Rheumatoid fact SerPl-aCnc: <10 IU/mL (ref <14.0)

## 2022-05-22 ENCOUNTER — Other Ambulatory Visit: Payer: Self-pay | Admitting: Family Medicine

## 2022-05-22 DIAGNOSIS — R002 Palpitations: Secondary | ICD-10-CM | POA: Diagnosis not present

## 2022-05-22 MED ORDER — MECLIZINE HCL 25 MG PO TABS: 25.0000 mg | ORAL_TABLET | Freq: Two times a day (BID) | ORAL | 0 refills | Status: DC | PRN

## 2022-06-04 ENCOUNTER — Telehealth: Payer: Self-pay | Admitting: Family Medicine

## 2022-06-04 ENCOUNTER — Other Ambulatory Visit: Payer: Self-pay | Admitting: Family Medicine

## 2022-06-04 DIAGNOSIS — H547 Unspecified visual loss: Secondary | ICD-10-CM

## 2022-06-04 DIAGNOSIS — L659 Nonscarring hair loss, unspecified: Secondary | ICD-10-CM

## 2022-06-04 DIAGNOSIS — Z8349 Family history of other endocrine, nutritional and metabolic diseases: Secondary | ICD-10-CM

## 2022-06-04 NOTE — Telephone Encounter (Signed)
Noted. No need to place a new referral. I will change the location on the original referral.

## 2022-06-04 NOTE — Telephone Encounter (Signed)
The patient has been scheduled for Friday 7 am at [facility]. This was the soonest available appointment at any of the alternative sites.

## 2022-06-04 NOTE — Telephone Encounter (Signed)
Pt called stating she was told to get a MRI downstairs and come upstairs for labs however, Imaging will not have their MRI truck until Saturday 06/07/2022. Pt is now asking if she needs to go to another location for the MRI.

## 2022-06-06 ENCOUNTER — Ambulatory Visit (HOSPITAL_BASED_OUTPATIENT_CLINIC_OR_DEPARTMENT_OTHER)
Admission: RE | Admit: 2022-06-06 | Discharge: 2022-06-06 | Disposition: A | Discharge status: Home / Self Care | Payer: 59 | Source: Ambulatory Visit | Attending: Family Medicine | Admitting: Family Medicine

## 2022-06-06 DIAGNOSIS — H547 Unspecified visual loss: Secondary | ICD-10-CM

## 2022-06-06 MED ORDER — GADOBUTROL 1 MMOL/ML IV SOLN
6.0000 mL | Freq: Once | INTRAVENOUS | Status: AC | PRN
  Administered 2022-06-06: 6 mL via INTRAVENOUS
  Filled 2022-06-06: qty 6

## 2022-06-09 LAB — THYROID PEROXIDASE ANTIBODIES (TPO) (REFL): Thyroperoxidase Ab SerPl-aCnc: 2 IU/mL (ref <9)

## 2022-06-15 NOTE — Progress Notes (Signed)
Established patient visit   Patient: Jacqueline Huerta   DOB: September 19, 1980   42 y.o. Female  MRN: [id number] Visit Date: 06/16/2022  Today's healthcare provider: Charlton Amor, DO   Chief Complaint  Patient presents with   Anxiety    SUBJECTIVE    Chief Complaint  Patient presents with   Anxiety    I connected with  Katima Pashia on 06/16/22 by a video and audio enabled telemedicine application and verified that I am speaking with the correct person using two identifiers.  Patient Location: Home  Provider Location: Office/Clinic  I discussed the limitations of evaluation and management by telemedicine. The patient expressed understanding and agreed to proceed.  HPI  Pt presents via video visit for follow up. She had side effects of lexapro and stopped this medication.   Zio patch was negative but she is still experiencing palpitations and dizziness. She says the dizziness is constant. She has not tried the meclizine and needs another prescription.  Two weeks ago, she experienced sudden vision loss that resolved. MRI brain was negative.   Review of Systems  Constitutional:  Negative for activity change, fatigue and fever.  Respiratory:  Negative for cough and shortness of breath.   Cardiovascular:  Negative for chest pain.  Gastrointestinal:  Negative for abdominal pain.  Genitourinary:  Negative for difficulty urinating.  Neurological:  Positive for dizziness.       Current Meds  Medication Sig   Biotin w/ Vitamins C & E (HAIR SKIN & NAILS GUMMIES PO) Hair Skin & Nails   Vitamin D, Ergocalciferol, (DRISDOL) 1.25 MG (50000 UNIT) CAPS capsule Take 1 capsule (50,000 Units total) by mouth every 7 (seven) days.    OBJECTIVE    Ht 5\' 2"  (1.575 m)   Wt 132 lb 15 oz (60.3 kg)   BMI 24.31 kg/m   Physical Exam Vitals reviewed.  Constitutional:      Appearance: She is well-developed.  HENT:     Head: Normocephalic and atraumatic.  Eyes:      Conjunctiva/sclera: Conjunctivae normal.  Cardiovascular:     Rate and Rhythm: Normal rate.  Pulmonary:     Effort: Pulmonary effort is normal.  Skin:    General: Skin is dry.     Coloration: Skin is not pale.  Neurological:     Mental Status: She is alert and oriented to person, place, and time.  Psychiatric:        Behavior: Behavior normal.       ASSESSMENT & PLAN    Problem List Items Addressed This Visit       Cardiovascular and Mediastinum   Ocular migraine    Sudden vision loss and then return to vision about 20 minutes later sounds like an ocular migraine.  Will send to ophthalmology for further assessment.      Relevant Orders   Ambulatory referral to Ophthalmology     Other   Palpitations    Zio patch was negative for any arrhythmias.  Patient is still experiencing palpitations.  She is also associating the palpitations with dizziness      Dizziness - Primary    Patient notes dizziness has gotten worse.  She has not taken the meclizine.  Advised her to start taking this to see if it helps.  If it does this could be an inner ear problem which would require referral to ENT.      Relevant Medications   meclizine (ANTIVERT) 25 MG tablet  Other Relevant Orders   Ambulatory referral to Cardiology   History of [facility] spotted fever    Patient's vague symptoms along with past medical history of [facility] spotted fever about a year ago makes me wonder if this is related.  Will send referral to infectious disease to see if they can help [place] identify her chronic fatigue, dizziness, palpitations       Relevant Orders   Ambulatory referral to Infectious Disease   Other Visit Diagnoses     Palpitation       Relevant Orders   Ambulatory referral to Cardiology       No follow-ups on file.      Meds ordered this encounter  Medications   meclizine (ANTIVERT) 25 MG tablet    Sig: Take 1 tablet (25 mg total) by mouth 2 (two) times daily as needed for  dizziness.    Dispense:  30 tablet    Refill:  0    Orders Placed This Encounter  Procedures   Ambulatory referral to Ophthalmology    Referral Priority:   Routine    Referral Type:   Consultation    Referral Reason:   Specialty Services Required    Requested Specialty:   Ophthalmology    Number of Visits Requested:   1   Ambulatory referral to Infectious Disease    Referral Priority:   Routine    Referral Type:   Consultation    Referral Reason:   Specialty Services Required    Requested Specialty:   Infectious Diseases    Number of Visits Requested:   1   Ambulatory referral to Cardiology    Referral Priority:   Routine    Referral Type:   Consultation    Referral Reason:   Specialty Services Required     Charlton Amor, DO  [facility] at [facility] [phone] (phone) [phone] (fax)  [facility]

## 2022-06-16 ENCOUNTER — Telehealth (INDEPENDENT_AMBULATORY_CARE_PROVIDER_SITE_OTHER): Payer: 59 | Admitting: Family Medicine

## 2022-06-16 ENCOUNTER — Encounter: Payer: Self-pay | Admitting: Family Medicine

## 2022-06-16 VITALS — Ht 62.0 in | Wt 132.9 lb

## 2022-06-16 DIAGNOSIS — R002 Palpitations: Secondary | ICD-10-CM | POA: Diagnosis not present

## 2022-06-16 DIAGNOSIS — G43109 Migraine with aura, not intractable, without status migrainosus: Secondary | ICD-10-CM | POA: Diagnosis not present

## 2022-06-16 DIAGNOSIS — Z8619 Personal history of other infectious and parasitic diseases: Secondary | ICD-10-CM | POA: Diagnosis not present

## 2022-06-16 DIAGNOSIS — R42 Dizziness and giddiness: Secondary | ICD-10-CM | POA: Diagnosis not present

## 2022-06-16 LAB — MTHFR DNA ANALYSIS: Methylenetetrahydrofolate Reductase (MTHFR),DNA: POSITIVE

## 2022-06-16 LAB — METHYLMALONIC ACID, SERUM: Methylmalonic Acid, Quant: 181 nmol/L (ref 87–318)

## 2022-06-16 MED ORDER — MECLIZINE HCL 25 MG PO TABS
25.0000 mg | ORAL_TABLET | Freq: Two times a day (BID) | ORAL | 0 refills | Status: DC | PRN
Start: 2022-06-16 — End: 2022-11-17

## 2022-06-16 NOTE — Assessment & Plan Note (Signed)
Sudden vision loss and then return to vision about 20 minutes later sounds like an ocular migraine.  Will send to ophthalmology for further assessment.

## 2022-06-16 NOTE — Assessment & Plan Note (Addendum)
Patient's vague symptoms along with past medical history of [facility] spotted fever about a year ago makes me wonder if this is related.  Will send referral to infectious disease to see if they can help [place] identify her chronic fatigue, dizziness, palpitations

## 2022-06-16 NOTE — Assessment & Plan Note (Signed)
Patient notes dizziness has gotten worse.  She has not taken the meclizine.  Advised her to start taking this to see if it helps.  If it does this could be an inner ear problem which would require referral to ENT.

## 2022-06-16 NOTE — Assessment & Plan Note (Signed)
Zio patch was negative for any arrhythmias.  Patient is still experiencing palpitations.  She is also associating the palpitations with dizziness

## 2022-06-19 ENCOUNTER — Encounter: Payer: Self-pay | Admitting: Obstetrics and Gynecology

## 2022-06-19 ENCOUNTER — Other Ambulatory Visit: Payer: Self-pay | Admitting: Family Medicine

## 2022-06-19 ENCOUNTER — Ambulatory Visit (INDEPENDENT_AMBULATORY_CARE_PROVIDER_SITE_OTHER): Payer: 59 | Admitting: Obstetrics and Gynecology

## 2022-06-19 ENCOUNTER — Other Ambulatory Visit (HOSPITAL_COMMUNITY)
Admission: RE | Admit: 2022-06-19 | Discharge: 2022-06-19 | Disposition: A | Discharge status: Home / Self Care | Payer: 59 | Source: Ambulatory Visit | Attending: Obstetrics and Gynecology | Admitting: Obstetrics and Gynecology

## 2022-06-19 VITALS — BP 112/81 | HR 94 | Ht 62.0 in | Wt 135.0 lb

## 2022-06-19 DIAGNOSIS — N923 Ovulation bleeding: Secondary | ICD-10-CM | POA: Diagnosis not present

## 2022-06-19 DIAGNOSIS — R768 Other specified abnormal immunological findings in serum: Secondary | ICD-10-CM

## 2022-06-19 NOTE — Progress Notes (Signed)
NEW GYNECOLOGY VISIT  Subjective:  Jacqueline Huerta is a 42 y.o. [phone] with LMP 06/09/22 referred for discussion of AUB.   Pt reports abnormal periods for the past 2-3 years. Reports that she has monthly cycles that vary in length, but when her bleeding slows she will continue to have dark brown discharge for 2 more weeks. She reports that her cycles are heavy and she needs to change a tampon every hour on the heaviest days of flow. Have been like this for years.   Denies acne, hair growth, skipping months on her period, postcoital bleeding.  Is in process of a rheum/endo work up. Symptoms include persistent dizziness & hair thinning. I personally reviewed the following: TSH 1.84 on 05/19/22 fT4 1.35 on 05/19/22 Total T 36 on 04/10/22 Hgb 14.4 on 02/20/22 Pap NILM/HPV negative on 02/20/22  PapHx - Remote hx abnormal paps requiring laser conization of cervix (~2007) 06/08/17 NILM/HPV neg 02/20/22 NILM/HPV neg  Past Medical History:  Diagnosis Date   Anxiety    Breast tenderness in female    Dysplastic nevus    History of abnormal cervical Pap smear    Hypertension    Past Surgical History:  Procedure Laterality Date   BREAST ENHANCEMENT SURGERY     CESAREAN SECTION     x 2   CESAREAN SECTION WITH BILATERAL TUBAL LIGATION N/A 11/09/2019   Procedure: CESAREAN SECTION WITH BILATERAL TUBAL LIGATION;  Surgeon: Carlisle Cater, MD;  Location: [facility] LD ORS;  Service: Obstetrics;  Laterality: N/A;  request RNFA   LASER ABLATION OF THE CERVIX  2009   MOLE REMOVAL     TUBAL LIGATION     Current Outpatient Medications on File Prior to Visit  Medication Sig Dispense Refill   Biotin w/ Vitamins C & E (HAIR SKIN & NAILS GUMMIES PO) Hair Skin & Nails     meclizine (ANTIVERT) 25 MG tablet Take 1 tablet (25 mg total) by mouth 2 (two) times daily as needed for dizziness. 30 tablet 0   Vitamin D, Ergocalciferol, (DRISDOL) 1.25 MG (50000 UNIT) CAPS capsule Take 1 capsule (50,000 Units  total) by mouth every 7 (seven) days. 8 capsule 1   No current facility-administered medications on file prior to visit.   Allergies  Allergen Reactions   Nitrofurantoin Nausea And Vomiting   Amoxicillin Nausea Only   OB History     Gravida  4   Para  3   Term  3   Preterm      AB  1   Living  3      SAB  1   IAB      Ectopic      Multiple  0   Live Births  2          Social History   Socioeconomic History   Marital status: Married    Spouse name: Not on file   Number of children: 2   Years of education: Not on file   Highest education level: Not on file  Occupational History   Not on file  Tobacco Use   Smoking status: Never   Smokeless tobacco: Never  Vaping Use   Vaping Use: Never used  Substance and Sexual Activity   Alcohol use: Not Currently    Alcohol/week: 1.0 standard drink of alcohol    Types: 1 Standard drinks or equivalent per week   Drug use: Never   Sexual activity: Yes    Birth control/protection: Surgical  Other  Topics Concern   Not on file  Social History Narrative   Not on file   Social Determinants of Health   Financial Resource Strain: Not on file  Food Insecurity: Not on file  Transportation Needs: Not on file  Physical Activity: Not on file  Stress: Not on file  Social Connections: Not on file  Intimate Partner Violence: Not on file   Objective:   Vitals:   06/19/22 0932  BP: 112/81  Pulse: 94  Weight: 135 lb (61.2 kg)  Height:  (1.575 m)   General:  Alert, oriented and cooperative. Patient is in no acute distress.  Skin: Skin is warm and dry. No rash noted.   Cardiovascular: Normal heart rate noted  Respiratory: Normal respiratory effort, no problems with respiration noted  Abdomen: Soft, non-tender, non-distended   Pelvic: Unremarkable. No cervical polyp noted.   Exam performed in the presence of a chaperone  Assessment and Plan:  Jacqueline Huerta is a 42 y.o. with AUB/intermenstrual  bleeding  1. AUB/Intermenstrual spotting Reviewed focused ddx includes but is not limited to infection, endometrial polyps, SM fibroid.  Discussed option for hormonal contraception to help make bleeding more predictable while we complete her work up. She would like to defer treatment for now.  - [place] Sonohysterogram; Future - Cervicovaginal ancillary only( [place])  Return in about 6 weeks (around 07/31/2022) for video visit to follow up ultrasound results.  Future Appointments  Date Time Provider Department Center  07/02/2022  3:00 PM Comer, Belia Heman, MD [facility]  07/24/2022  4:00 PM Lewayne Bunting, MD [facility] None  10/10/2022 10:20 AM Pollyann Savoy, MD [facility] None  11/04/2022  1:40 PM Pollyann Savoy, MD [facility] None  02/24/2023  8:50 AM Charlton Amor, DO [facility] None   Lennart Pall, MD

## 2022-06-20 LAB — CERVICOVAGINAL ANCILLARY ONLY
Bacterial Vaginitis (gardnerella): NEGATIVE
Candida Glabrata: NEGATIVE
Candida Vaginitis: NEGATIVE
Chlamydia: NEGATIVE
Comment: NEGATIVE
Comment: NEGATIVE
Comment: NEGATIVE
Comment: NEGATIVE
Comment: NEGATIVE
Comment: NORMAL
Neisseria Gonorrhea: NEGATIVE
Trichomonas: NEGATIVE

## 2022-06-30 ENCOUNTER — Telehealth (HOSPITAL_BASED_OUTPATIENT_CLINIC_OR_DEPARTMENT_OTHER): Payer: Self-pay | Admitting: Obstetrics & Gynecology

## 2022-06-30 NOTE — Telephone Encounter (Signed)
Called patient and left a message to call the office back to schedule the appointment .  

## 2022-07-02 ENCOUNTER — Other Ambulatory Visit: Payer: Self-pay

## 2022-07-02 ENCOUNTER — Ambulatory Visit (INDEPENDENT_AMBULATORY_CARE_PROVIDER_SITE_OTHER): Payer: 59 | Admitting: Internal Medicine

## 2022-07-02 ENCOUNTER — Encounter: Payer: Self-pay | Admitting: Internal Medicine

## 2022-07-02 VITALS — BP 108/72 | HR 84 | Temp 98.0°F | Ht 62.0 in | Wt 134.0 lb

## 2022-07-02 DIAGNOSIS — Z8619 Personal history of other infectious and parasitic diseases: Secondary | ICD-10-CM | POA: Diagnosis not present

## 2022-07-02 NOTE — Progress Notes (Signed)
[facility]      Reason for Consult:history of RMSF    Referring Physician: Dr. Tamera Punt    Patient ID: Jacqueline Huerta, female    DOB: Aug 14, 1980, 42 y.o.   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   She has a history of a febrile illness last year and had a fever about 2 weeks and was tested for RMSF which was equivocal for the IgM.  She was not having any headache or rash at that time but was given doxycycline.   She is more recently undergoing evaluation for issues with palpatations, dizziness, alopecia and aches and is positive for ANA and dsDNA and followed by rheumatology.  She is not entirely sure why she was referred here.    Past Medical History:  Diagnosis Date   Anxiety    Breast tenderness in female    Dysplastic nevus    History of abnormal cervical Pap smear    Hypertension     Prior to Admission medications   Medication Sig Start Date End Date Taking? Authorizing Provider  Biotin w/ Vitamins C & E (HAIR SKIN & NAILS GUMMIES PO) Hair Skin & Nails   Yes [provider]  Vitamin D, Ergocalciferol, (DRISDOL) 1.25 MG (50000 UNIT) CAPS capsule Take 1 capsule (50,000 Units total) by mouth every 7 (seven) days. 04/21/22  Yes Rucker, Magdalen Spatz, MD  meclizine (ANTIVERT) 25 MG tablet Take 1 tablet (25 mg total) by mouth 2 (two) times daily as needed for dizziness. Patient not taking: Reported on 07/02/2022 06/16/22   Charlton Amor, DO    Allergies  Allergen Reactions   Nitrofurantoin Nausea And Vomiting   Amoxicillin Nausea Only    Social History   Tobacco Use   Smoking status: Never   Smokeless tobacco: Never  Vaping Use   Vaping Use: Never used  Substance Use Topics   Alcohol use: Not Currently    Alcohol/week: 1.0 standard drink of alcohol    Types: 1 Standard drinks or equivalent per week   Drug use: Never    Family History  Problem Relation Age of Onset   Hyperlipidemia Mother     Alcohol abuse Father    Heart attack Maternal Grandfather    Colon cancer Paternal Grandmother    Lung cancer Maternal Aunt    Hypertension Other     Review of Systems  Constitutional: negative for fevers and chills All other systems reviewed and are negative    Constitutional: in no apparent distress  Vitals:   07/02/22 1457  BP: 108/72  Pulse: 84  Temp: 98 F (36.7 C)  SpO2: 100%   EYES: anicteric ENMT: no thrush Respiratory: normal respiratory effort GI: soft Musculoskeletal: no edema   Labs: Lab Results  Component Value Date   WBC 7.0 02/20/2022   HGB 14.4 02/20/2022   HCT 44.1 02/20/2022   MCV 91 02/20/2022   PLT 197 02/20/2022    Lab Results  Component Value Date   CREATININE 0.71 02/20/2022   BUN 9 02/20/2022   NA 141 02/20/2022   K 3.8 02/20/2022   CL 103 02/20/2022   CO2 23 02/20/2022    Lab Results  Component Value Date   ALT 12 02/20/2022   AST 13 02/20/2022   ALKPHOS 72 02/20/2022   BILITOT 0.3 02/20/2022    Assessment: history of [place] fever testing that was equivocal.  I reviewed  the test with the patient which was an IgM which was not positive and more than a week into her febrile course so not c/w a positive RMSF titer.  Additionally, she had no symptoms c/w RMSF.    Plan: 1)  no treatment or evaluation indicated 2) continue with rheumatology follow up 3) previous hepatitis B and C testing and recommend routine HIV testing if not recently done.

## 2022-07-11 ENCOUNTER — Emergency Department (HOSPITAL_BASED_OUTPATIENT_CLINIC_OR_DEPARTMENT_OTHER)
Admission: EM | Admit: 2022-07-11 | Discharge: 2022-07-11 | Disposition: A | Discharge status: Home / Self Care | Payer: 59 | Attending: Emergency Medicine | Admitting: Emergency Medicine

## 2022-07-11 ENCOUNTER — Emergency Department (HOSPITAL_BASED_OUTPATIENT_CLINIC_OR_DEPARTMENT_OTHER): Payer: 59

## 2022-07-11 ENCOUNTER — Other Ambulatory Visit: Payer: Self-pay

## 2022-07-11 ENCOUNTER — Encounter (HOSPITAL_BASED_OUTPATIENT_CLINIC_OR_DEPARTMENT_OTHER): Payer: Self-pay

## 2022-07-11 DIAGNOSIS — R42 Dizziness and giddiness: Secondary | ICD-10-CM | POA: Diagnosis present

## 2022-07-11 DIAGNOSIS — M79605 Pain in left leg: Secondary | ICD-10-CM | POA: Diagnosis not present

## 2022-07-11 DIAGNOSIS — I10 Essential (primary) hypertension: Secondary | ICD-10-CM | POA: Diagnosis not present

## 2022-07-11 LAB — COMPREHENSIVE METABOLIC PANEL
ALT: 14 U/L (ref 0–44)
AST: 18 U/L (ref 15–41)
Albumin: 4.3 g/dL (ref 3.5–5.0)
Alkaline Phosphatase: 63 U/L (ref 38–126)
Anion gap: 6 (ref 5–15)
BUN: 11 mg/dL (ref 6–20)
CO2: 27 mmol/L (ref 22–32)
Calcium: 8.4 mg/dL — ABNORMAL LOW (ref 8.9–10.3)
Chloride: 104 mmol/L (ref 98–111)
Creatinine, Ser: 0.73 mg/dL (ref 0.44–1.00)
GFR, Estimated: >60 mL/min (ref >60)
Glucose, Bld: 98 mg/dL (ref 70–99)
Potassium: 3.6 mmol/L (ref 3.5–5.1)
Sodium: 137 mmol/L (ref 135–145)
Total Bilirubin: 0.4 mg/dL (ref 0.3–1.2)
Total Protein: 7.4 g/dL (ref 6.5–8.1)

## 2022-07-11 LAB — CBC
HCT: 43.6 % (ref 36.0–46.0)
Hemoglobin: 14.1 g/dL (ref 12.0–15.0)
MCH: 29.1 pg (ref 26.0–34.0)
MCHC: 32.3 g/dL (ref 30.0–36.0)
MCV: 90.1 fL (ref 80.0–100.0)
Platelets: 209 10*3/uL (ref 150–400)
RBC: 4.84 MIL/uL (ref 3.87–5.11)
RDW: 12 % (ref 11.5–15.5)
WBC: 7 10*3/uL (ref 4.0–10.5)
nRBC: 0 % (ref 0.0–0.2)

## 2022-07-11 MED ORDER — PROMETHAZINE HCL 25 MG PO TABS: 25.0000 mg | ORAL_TABLET | Freq: Four times a day (QID) | ORAL | 0 refills | Status: DC | PRN

## 2022-07-11 MED ORDER — SODIUM CHLORIDE 0.9 % IV BOLUS
500.0000 mL | Freq: Once | INTRAVENOUS | Status: AC
  Administered 2022-07-11: 500 mL via INTRAVENOUS

## 2022-07-11 NOTE — ED Triage Notes (Signed)
Patient having been dizzy this week. She stated it is getting worse. Also when she stands she says her right 4th toes turns blue and she has leg pain.

## 2022-07-11 NOTE — ED Notes (Signed)
RN introduced self to pt. Pt in bed, NAD noted. Pt reports feeling dizzy even with laying down, denies pain.

## 2022-07-11 NOTE — ED Provider Notes (Signed)
[place] [facility] AT [facility] Provider Note   CSN: [id number] Arrival date & time: 07/11/22  4098     History  Chief Complaint  Patient presents with   Leg Pain   Dizziness    Jacqueline Huerta is a 42 y.o. female.   Leg Pain Dizziness Patient presents with dizziness.  Has had for over 6 months.  Worse the last week.  Feels dizzy with the episode.  Some nausea.  Worse with standing.  Has had extensive workup overall has been diagnosed with antiphospholipid and antidouble-stranded DNA.  Has not seen ENT for the dizziness yet.  Reviewed notes from PCP.    Past Medical History:  Diagnosis Date   Anxiety    Breast tenderness in female    Dysplastic nevus    History of abnormal cervical Pap smear    Hypertension     Home Medications Prior to Admission medications   Medication Sig Start Date End Date Taking? Authorizing Provider  promethazine (PHENERGAN) 25 MG tablet Take 1 tablet (25 mg total) by mouth every 6 (six) hours as needed for nausea (vertigo). 07/11/22  Yes Benjiman Core, MD  Biotin w/ Vitamins C & E (HAIR SKIN & NAILS GUMMIES PO) Hair Skin & Nails    [provider]  meclizine (ANTIVERT) 25 MG tablet Take 1 tablet (25 mg total) by mouth 2 (two) times daily as needed for dizziness. Patient not taking: Reported on 07/02/2022 06/16/22   Charlton Amor, DO  Vitamin D, Ergocalciferol, (DRISDOL) 1.25 MG (50000 UNIT) CAPS capsule Take 1 capsule (50,000 Units total) by mouth every 7 (seven) days. 04/21/22   Suzan Slick, MD      Allergies    Nitrofurantoin and Amoxicillin    Review of Systems   Review of Systems  Neurological:  Positive for dizziness.    Physical Exam Updated Vital Signs BP 113/86   Pulse 85   Temp 98.1 F (36.7 C) (Oral)   Resp 15   Ht 5\' 2"  (1.575 m)   Wt 59 kg   LMP 06/09/2022   SpO2 99%   BMI 23.78 kg/m  Physical Exam Vitals and nursing note reviewed.  HENT:     Head: Normocephalic.   Pulmonary:     Breath sounds: No wheezing.  Abdominal:     Tenderness: There is no abdominal tenderness.  Musculoskeletal:        General: No tenderness.     Cervical back: Neck supple.  Skin:    General: Skin is warm.  Neurological:     Mental Status: She is alert and oriented to person, place, and time.     ED Results / Procedures / Treatments   Labs (all labs ordered are listed, but only abnormal results are displayed) Labs Reviewed  COMPREHENSIVE METABOLIC PANEL - Abnormal; Notable for the following components:      Result Value   Calcium 8.4 (*)    All other components within normal limits  CBC    EKG None  Radiology US Venous Img Lower Unilateral Left  Result Date: 07/11/2022 CLINICAL DATA:  Left leg pain and swelling, clotting disorder EXAM: LEFT LOWER EXTREMITY VENOUS DOPPLER ULTRASOUND TECHNIQUE: Gray-scale sonography with compression, as well as color and duplex ultrasound, were performed to evaluate the deep venous system(s) from the level of the common femoral vein through the popliteal and proximal calf veins. COMPARISON:  None Available. FINDINGS: VENOUS Normal compressibility of the common femoral, superficial femoral, and popliteal veins,  as well as the visualized calf veins. Visualized portions of profunda femoral vein and great saphenous vein unremarkable. No filling defects to suggest DVT on grayscale or color Doppler imaging. Doppler waveforms show normal direction of venous flow, normal respiratory plasticity and response to augmentation. Limited views of the contralateral common femoral vein are unremarkable. OTHER None. Limitations: none IMPRESSION: Negative. Electronically Signed   By: Helyn Numbers M.D.   On: 07/11/2022 21:16    Procedures Procedures    Medications Ordered in ED Medications  sodium chloride 0.9 % bolus 500 mL (500 mLs Intravenous New Bag/Given 07/11/22 2048)    ED Course/ Medical Decision Making/ A&P                              Medical Decision Making Amount and/or Complexity of Data Reviewed Labs: ordered.  Risk Prescription drug management.   Patient with dizziness.  Has had over 6 months of symptoms and has seen previous providers.  Diagnosed with likely peripheral vertigo.  Has had MRI that was negative.  Had did not tolerate Antivert.  Worried about blood clot.  Felt less likely with MRI not showing abnormality.  However left TM does show some obstruction with cerumen.  Will remove and see if we can improve symptoms.  Will also get some basic blood work.  States her left fourth toe will also turn blue when she is standing on it.  States when she stands on it we will go up the left leg.  Doubt DVT at this time.  No swelling of the leg and pain is only with standing.  DVT ultrasound negative.  Blood work reassuring.  Doubt stroke with recent MRI.  Left TM does appear somewhat erythematous but not necessarily infected.  Will have follow-up with ENT.  Will discharge home.  Has tolerated Phenergan in the past during her pregnancy        Final Clinical Impression(s) / ED Diagnoses Final diagnoses:  Vertigo    Rx / DC Orders ED Discharge Orders          Ordered    promethazine (PHENERGAN) 25 MG tablet  Every 6 hours PRN        07/11/22 2137              Benjiman Core, MD 07/11/22 2142

## 2022-07-11 NOTE — Discharge Instructions (Signed)
Your workup was reassuring.  There was no clot in your leg.  Hopefully the Phenergan can help with some of the vertigo.  Follow-up with your primary care doctor for the discoloration of your toe.

## 2022-07-11 NOTE — ED Notes (Signed)
Pt advised she's been dealing with 6 months of dizziness. Acutely worse over last 7 days. Cannot function, drive, walk around, shop, etc without dizziness and near syncope. Pt advised dizziness is worse with standing. She advised she's been having several workups from Rheumatology, Cardiology, Dermatology, OB, and her PCP due to her complaints and recent diagnosis with a clotting disorder.

## 2022-07-14 ENCOUNTER — Telehealth: Payer: Self-pay | Admitting: General Practice

## 2022-07-14 NOTE — Transitions of Care (Post Inpatient/ED Visit) (Signed)
   07/14/2022  Name: Jacqueline Huerta MRN: [id number] DOB: 05-19-1980  Today's TOC FU Call Status: Today's TOC FU Call Status:: Successful TOC FU Call Competed TOC FU Call Complete Date: 07/14/22  Transition Care Management Follow-up Telephone Call Date of Discharge: 07/11/22 Discharge Facility: [facility] Type of Discharge: Emergency Department Reason for ED Visit: Other: (Veritgo) How have you been since you were released from the hospital?: Same Any questions or concerns?: No  Items Reviewed: Did you receive and understand the discharge instructions provided?: Yes Medications obtained,verified, and reconciled?: Yes (Medications Reviewed) Any new allergies since your discharge?: No Dietary orders reviewed?: NA Do you have support at home?: Yes  Medications Reviewed Today: Medications Reviewed Today     Reviewed by Modesto Charon, RN (Registered Nurse) on 07/14/22 at 1051  Med List Status: <None>   Medication Order Taking? Sig Documenting Provider Last Dose Status Informant  Biotin w/ Vitamins C & E (HAIR SKIN & NAILS GUMMIES PO) [id number] No Hair Skin & Nails [provider] Taking Active   meclizine (ANTIVERT) 25 MG tablet [id number] No Take 1 tablet (25 mg total) by mouth 2 (two) times daily as needed for dizziness.  Patient not taking: Reported on 07/02/2022   Charlton Amor, DO Not Taking Active   promethazine (PHENERGAN) 25 MG tablet [id number]  Take 1 tablet (25 mg total) by mouth every 6 (six) hours as needed for nausea (vertigo). Benjiman Core, MD  Active   Vitamin D, Ergocalciferol, (DRISDOL) 1.25 MG (50000 UNIT) CAPS capsule [id number] No Take 1 capsule (50,000 Units total) by mouth every 7 (seven) days. Suzan Slick, MD Taking Active             Home Care and Equipment/Supplies: Were Home Health Services Ordered?: NA Any new equipment or medical supplies ordered?: NA  Functional Questionnaire: Do you need assistance with  bathing/showering or dressing?: No Do you need assistance with meal preparation?: No Do you need assistance with eating?: No Do you have difficulty maintaining continence: No Do you need assistance with getting out of bed/getting out of a chair/moving?: No Do you have difficulty managing or taking your medications?: No  Follow up appointments reviewed: PCP Follow-up appointment confirmed?: NA Specialist Hospital Follow-up appointment confirmed?: NA Do you need transportation to your follow-up appointment?: No Do you understand care options if your condition(s) worsen?: Yes-patient verbalized understanding    SIGNATURE : Modesto Charon, RN BSN

## 2022-07-15 ENCOUNTER — Other Ambulatory Visit: Payer: Self-pay | Admitting: Family Medicine

## 2022-07-15 DIAGNOSIS — R42 Dizziness and giddiness: Secondary | ICD-10-CM

## 2022-07-16 ENCOUNTER — Ambulatory Visit (INDEPENDENT_AMBULATORY_CARE_PROVIDER_SITE_OTHER): Payer: 59

## 2022-07-16 ENCOUNTER — Ambulatory Visit (INDEPENDENT_AMBULATORY_CARE_PROVIDER_SITE_OTHER): Payer: 59 | Admitting: Obstetrics & Gynecology

## 2022-07-16 DIAGNOSIS — N923 Ovulation bleeding: Secondary | ICD-10-CM

## 2022-07-16 DIAGNOSIS — N926 Irregular menstruation, unspecified: Secondary | ICD-10-CM

## 2022-07-17 NOTE — Progress Notes (Signed)
Referring-Erika Tamera Punt, DO Reason for referral-palpitations and dizziness  HPI: 42 year old female for evaluation of palpitations and dizziness at request of Morey Hummingbird, DO.  Brain MRI April 2024 normal.  Monitor April 2024 showed sinus rhythm with rare PAC, atrial couplet and atrial triplet as well as rare PVC.  Venous Dopplers left lower extremity May 2024 showed no DVT.  Laboratories March 2024 showed TSH 1.840 and free T41.35.  Laboratories May 2024 showed hemoglobin 14.1, potassium 3.6, creatinine 0.73 and normal liver functions.  Recently complained of palpitations and dizziness and cardiology asked to evaluate.  Patient has had intermittent palpitations for years by report.  They have been more frequent recently.  They are described as a flip-flop and racing.  She did have some symptoms with the monitor in place.  She also notes continuous dizziness.  It is worse with standing and changing positions but she notices some dizziness when sitting and lying as well.  She has not had syncope.  She also complains of chest pain described as a sharp stabbing pain lasting seconds.  She denies orthopnea, PND or pedal edema.  She has had some dyspnea on exertion.  Cardiology asked to evaluate.  Note she is also being evaluated by rheumatology for abnormal laboratories.  Current Outpatient Medications  Medication Sig Dispense Refill   aspirin EC 81 MG tablet Take 81 mg by mouth daily. Swallow whole.     Biotin w/ Vitamins C & E (HAIR SKIN & NAILS GUMMIES PO) Hair Skin & Nails     ketoconazole (NIZORAL) 2 % shampoo Apply topically 2 (two) times a week.     Vitamin D, Ergocalciferol, (DRISDOL) 1.25 MG (50000 UNIT) CAPS capsule Take 1 capsule (50,000 Units total) by mouth every 7 (seven) days. 8 capsule 1   meclizine (ANTIVERT) 25 MG tablet Take 1 tablet (25 mg total) by mouth 2 (two) times daily as needed for dizziness. (Patient not taking: Reported on 07/24/2022) 30 tablet 0   promethazine (PHENERGAN) 25  MG tablet Take 1 tablet (25 mg total) by mouth every 6 (six) hours as needed for nausea (vertigo). (Patient not taking: Reported on 07/24/2022) 10 tablet 0   No current facility-administered medications for this visit.    Allergies  Allergen Reactions   Nitrofurantoin Nausea And Vomiting   Amoxicillin Nausea Only     Past Medical History:  Diagnosis Date   Anxiety    Breast tenderness in female    Dysplastic nevus    History of abnormal cervical Pap smear     Past Surgical History:  Procedure Laterality Date   BREAST ENHANCEMENT SURGERY     CESAREAN SECTION     x 2   CESAREAN SECTION WITH BILATERAL TUBAL LIGATION N/A 11/09/2019   Procedure: CESAREAN SECTION WITH BILATERAL TUBAL LIGATION;  Surgeon: Carlisle Cater, MD;  Location: [facility];  Service: Obstetrics;  Laterality: N/A;  request RNFA   LASER ABLATION OF THE CERVIX  2009   MOLE REMOVAL     TUBAL LIGATION      Social History   Socioeconomic History   Marital status: Married    Spouse name: Not on file   Number of children: 3   Years of education: Not on file   Highest education level: Not on file  Occupational History   Not on file  Tobacco Use   Smoking status: Never   Smokeless tobacco: Never  Vaping Use   Vaping Use: Never used  Substance and Sexual  Activity   Alcohol use: Yes    Alcohol/week: 1.0 standard drink of alcohol    Types: 1 Standard drinks or equivalent per week    Comment: Rare   Drug use: Never   Sexual activity: Yes    Birth control/protection: Surgical  Other Topics Concern   Not on file  Social History Narrative   Not on file   Social Determinants of Health   Financial Resource Strain: Not on file  Food Insecurity: No Food Insecurity (07/23/2022)   Hunger Vital Sign    Worried About Running Out of Food in the Last Year: Never true    Ran Out of Food in the Last Year: Never true  Transportation Needs: No Transportation Needs (07/14/2022)   PRAPARE - Scientist, research (physical sciences) (Medical): No    Lack of Transportation (Non-Medical): No  Physical Activity: Not on file  Stress: Not on file  Social Connections: Not on file  Intimate Partner Violence: Not At Risk (07/23/2022)   Humiliation, Afraid, Rape, and Kick questionnaire    Fear of Current or Ex-Partner: No    Emotionally Abused: No    Physically Abused: No    Sexually Abused: No    Family History  Problem Relation Age of Onset   Hyperlipidemia Mother    Alcohol abuse Father    Heart attack Maternal Grandfather    Colon cancer Paternal Grandmother    Lung cancer Maternal Aunt    Hypertension Other     ROS: no fevers or chills, productive cough, hemoptysis, dysphasia, odynophagia, melena, hematochezia, dysuria, hematuria, rash, seizure activity, orthopnea, PND, pedal edema, claudication. Remaining systems are negative.  Physical Exam:   Blood pressure 92/64, pulse 85, height 5\' 2"  (1.575 m), weight 136 lb 3.2 oz (61.8 kg), SpO2 99 %.  General:  Well developed/well nourished in NAD Skin warm/dry Patient not depressed No peripheral clubbing Back-normal HEENT-normal/normal eyelids Neck supple/normal carotid upstroke bilaterally; no bruits; no JVD; no thyromegaly chest - CTA/ normal expansion CV - RRR/normal S1 and S2; no murmurs, rubs or gallops;  PMI nondisplaced Abdomen -NT/ND, no HSM, no mass, + bowel sounds, no bruit 2+ femoral pulses, no bruits Ext-no edema, chords, 2+ DP Neuro-grossly nonfocal  ECG -normal sinus rhythm at a rate of 85, no ST changes.  Normal QT interval.  Personally reviewed  A/P  1 palpitations-patient did have palpitations with the monitor in place which showed PACs and PVCs.  We will arrange an echocardiogram to assess LV function.  I would be somewhat hesitant to add Toprol as her blood pressure is borderline.  However if her symptoms worsen we will try low-dose at night.  She will contact us if this happens.  2 dizziness-etiology unclear.  There is an  orthostatic component but she is also dizzy at times lying down and sitting.  I have asked her to increase her fluid and sodium intake.  We will follow and can consider additional medications for blood pressure support in the future such as midodrine if necessary.  3 chest pain-symptoms are atypical and electrocardiogram shows no ST changes.  Will not pursue further ischemia evaluation.  Olga Millers, MD

## 2022-07-22 ENCOUNTER — Other Ambulatory Visit: Payer: Self-pay | Admitting: Family

## 2022-07-22 DIAGNOSIS — R42 Dizziness and giddiness: Secondary | ICD-10-CM

## 2022-07-22 NOTE — Progress Notes (Signed)
GYNECOLOGY  VISIT  CC:  sonohysterogram results  HPI: 42 y.o. [phone] Married White or Caucasian female here for sonohysterogram in referral from Dr. Lenny Pastel.  Pt desired to discuss results today so reviewed that there were no abnormal findings including no polyps.  She really does not want to be on any hormonal therapy but does ask about what I think should be done.  Would recommend POP or combination OCP for 3 month trial to see if this will resolve her symptoms.  Then could determine if desired to stay on POP or combination OCP at that time.  Aware I will send results to Dr. Berton Lan for her review as well and follow up appt will be scheduled.  Pt appreciative of knowing results.   Past Medical History:  Diagnosis Date   Anxiety    Breast tenderness in female    Dysplastic nevus    History of abnormal cervical Pap smear    Hypertension     MEDS:   Current Outpatient Medications on File Prior to Visit  Medication Sig Dispense Refill   Biotin w/ Vitamins C & E (HAIR SKIN & NAILS GUMMIES PO) Hair Skin & Nails     meclizine (ANTIVERT) 25 MG tablet Take 1 tablet (25 mg total) by mouth 2 (two) times daily as needed for dizziness. (Patient not taking: Reported on 07/02/2022) 30 tablet 0   promethazine (PHENERGAN) 25 MG tablet Take 1 tablet (25 mg total) by mouth every 6 (six) hours as needed for nausea (vertigo). 10 tablet 0   Vitamin D, Ergocalciferol, (DRISDOL) 1.25 MG (50000 UNIT) CAPS capsule Take 1 capsule (50,000 Units total) by mouth every 7 (seven) days. 8 capsule 1   No current facility-administered medications on file prior to visit.    ALLERGIES: Nitrofurantoin and Amoxicillin  SH:  married, non smoker  Review of Systems  Constitutional: Negative.   Genitourinary:        Irregular bleeding/spotting    PHYSICAL EXAMINATION:    LMP 06/09/2022     General appearance: alert, cooperative and appears stated age  Assessment/Plan: 1. Irregular bleeding - SHGM normal  today.  She will follow up with Dr. Berton Lan for additional treatment planning

## 2022-07-23 ENCOUNTER — Inpatient Hospital Stay (HOSPITAL_BASED_OUTPATIENT_CLINIC_OR_DEPARTMENT_OTHER): Payer: 59 | Admitting: Family

## 2022-07-23 ENCOUNTER — Encounter: Payer: Self-pay | Admitting: Family

## 2022-07-23 ENCOUNTER — Inpatient Hospital Stay: Payer: 59 | Attending: Hematology & Oncology

## 2022-07-23 VITALS — BP 124/74 | HR 81 | Temp 98.0°F | Resp 17 | Ht 62.0 in | Wt 134.8 lb

## 2022-07-23 DIAGNOSIS — D509 Iron deficiency anemia, unspecified: Secondary | ICD-10-CM

## 2022-07-23 DIAGNOSIS — Z801 Family history of malignant neoplasm of trachea, bronchus and lung: Secondary | ICD-10-CM

## 2022-07-23 DIAGNOSIS — Z800 Family history of malignant neoplasm of digestive organs: Secondary | ICD-10-CM | POA: Insufficient documentation

## 2022-07-23 DIAGNOSIS — D500 Iron deficiency anemia secondary to blood loss (chronic): Secondary | ICD-10-CM

## 2022-07-23 DIAGNOSIS — R760 Raised antibody titer: Secondary | ICD-10-CM

## 2022-07-23 DIAGNOSIS — E7212 Methylenetetrahydrofolate reductase deficiency: Secondary | ICD-10-CM | POA: Insufficient documentation

## 2022-07-23 DIAGNOSIS — R42 Dizziness and giddiness: Secondary | ICD-10-CM

## 2022-07-23 LAB — CBC WITH DIFFERENTIAL (CANCER CENTER ONLY)
Abs Immature Granulocytes: 0.01 10*3/uL (ref 0.00–0.07)
Basophils Absolute: 0 10*3/uL (ref 0.0–0.1)
Basophils Relative: 1 %
Eosinophils Absolute: 0.1 10*3/uL (ref 0.0–0.5)
Eosinophils Relative: 2 %
HCT: 42.6 % (ref 36.0–46.0)
Hemoglobin: 13.8 g/dL (ref 12.0–15.0)
Immature Granulocytes: 0 %
Lymphocytes Relative: 25 %
Lymphs Abs: 1.6 10*3/uL (ref 0.7–4.0)
MCH: 29.6 pg (ref 26.0–34.0)
MCHC: 32.4 g/dL (ref 30.0–36.0)
MCV: 91.4 fL (ref 80.0–100.0)
Monocytes Absolute: 0.5 10*3/uL (ref 0.1–1.0)
Monocytes Relative: 8 %
Neutro Abs: 4 10*3/uL (ref 1.7–7.7)
Neutrophils Relative %: 64 %
Platelet Count: 183 10*3/uL (ref 150–400)
RBC: 4.66 MIL/uL (ref 3.87–5.11)
RDW: 12.1 % (ref 11.5–15.5)
WBC Count: 6.2 10*3/uL (ref 4.0–10.5)
nRBC: 0 % (ref 0.0–0.2)

## 2022-07-23 LAB — CMP (CANCER CENTER ONLY)
ALT: 11 U/L (ref 0–44)
AST: 13 U/L — ABNORMAL LOW (ref 15–41)
Albumin: 4.8 g/dL (ref 3.5–5.0)
Alkaline Phosphatase: 56 U/L (ref 38–126)
Anion gap: 10 (ref 5–15)
BUN: 14 mg/dL (ref 6–20)
CO2: 31 mmol/L (ref 22–32)
Calcium: 9.6 mg/dL (ref 8.9–10.3)
Chloride: 101 mmol/L (ref 98–111)
Creatinine: 0.76 mg/dL (ref 0.44–1.00)
GFR, Estimated: >60 mL/min (ref >60)
Glucose, Bld: 103 mg/dL — ABNORMAL HIGH (ref 70–99)
Potassium: 4.2 mmol/L (ref 3.5–5.1)
Sodium: 142 mmol/L (ref 135–145)
Total Bilirubin: 0.3 mg/dL (ref 0.3–1.2)
Total Protein: 6.7 g/dL (ref 6.5–8.1)

## 2022-07-23 LAB — FERRITIN: Ferritin: 10 ng/mL — ABNORMAL LOW (ref 11–307)

## 2022-07-23 NOTE — Progress Notes (Signed)
Hematology/Oncology Consultation   Name: Jacqueline Huerta      MRN: [id number]    Location: Room/bed info not found  Date: 07/23/2022 Time:2:43 PM   REFERRING PHYSICIAN:  Morey Hummingbird, DO  REASON FOR CONSULT:  Dizziness   DIAGNOSIS:  Work-up for IDA pending.  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  She has no personal or known familial history of thrombotic event.  She has had persistent dizziness and states that she had temporary blurred/loss of vision recently prompting her PCP to get an MRI of the brain. MRI was negative.  Her cycle is regular with heavy flow and then a brown discharge for 2 weeks after.  She had a sonohysterogram on 07/16/2022 and results were negative.  She has 3 children and history of miscarriage after her second child. She states that she was on baby aspirin with her pregnancies.  She has had a breast augmentation as well as 3 c-sections in the past. She states that her only complication with surgery was infection at the c-section site with her first baby.  No other blood loss noted. No abnormal bruising, no petechiae.  She is symptomatic with fatigue, dizziness, SOB with exertion, tingling in the hands and feet with numbness at times in the left leg, discoloration of fourth toe on left foot (purple), blurred loss of vision, palpitations and nausea with the episodes of dizziness.  She is seeing Rheumatology to work up for possible Lupus. She was positive for antiphospholipid and anti-double stranded DNA. She sees the again next month.  She has appointment with cardiology tomorrow for intermittent chest pain and palpitations.   She has seen dermatology and states that the biopsy of her left cheek  came back negative. No history of diabetes or thyroid disease.  No personal or familial history of cancer.  No fever, chills, vomiting, cough, rash, abdominal pain or changes in bowel or bladder habits.  No falls or syncope reported.  No smoking or recreational drug use. Rare ETOH socially.  Appetite and hydration are good. Weight is stable at 134 lbs.   ROS: All other 10 point review of systems is negative.   PAST MEDICAL HISTORY:   Past Medical History:  Diagnosis Date   Anxiety    Breast tenderness in female    Dysplastic nevus    History of abnormal cervical Pap smear    Hypertension     ALLERGIES: Allergies  Allergen Reactions   Nitrofurantoin Nausea And Vomiting   Amoxicillin Nausea Only      MEDICATIONS:  Current Outpatient Medications on File Prior to Visit  Medication Sig Dispense Refill   Biotin w/ Vitamins C & E (HAIR SKIN & NAILS GUMMIES PO) Hair Skin & Nails     meclizine (ANTIVERT) 25 MG tablet Take 1 tablet (25 mg total) by mouth 2 (two) times daily as needed for dizziness. (Patient not taking: Reported on 07/02/2022) 30 tablet 0   promethazine (PHENERGAN) 25 MG tablet Take 1 tablet (25 mg total) by mouth every 6 (six) hours as needed for nausea (vertigo). 10 tablet 0   Vitamin D, Ergocalciferol, (DRISDOL) 1.25 MG (50000 UNIT) CAPS capsule Take 1 capsule (50,000 Units total) by mouth every 7 (seven) days. 8 capsule 1   No current facility-administered medications  on file prior to visit.     PAST SURGICAL HISTORY Past Surgical History:  Procedure Laterality Date   BREAST ENHANCEMENT SURGERY     CESAREAN SECTION     x 2   CESAREAN SECTION WITH BILATERAL TUBAL LIGATION N/A 11/09/2019   Procedure: CESAREAN SECTION WITH BILATERAL TUBAL LIGATION;  Surgeon: Carlisle Cater, MD;  Location: [facility];  Service: Obstetrics;  Laterality: N/A;  request RNFA   LASER ABLATION OF THE CERVIX  2009   MOLE REMOVAL     TUBAL LIGATION      FAMILY HISTORY: Family History   Problem Relation Age of Onset   Hyperlipidemia Mother    Alcohol abuse Father    Heart attack Maternal Grandfather    Colon cancer Paternal Grandmother    Lung cancer Maternal Aunt    Hypertension Other     SOCIAL HISTORY:  reports that she has never smoked. She has never used smokeless tobacco. She reports that she does not currently use alcohol after a past usage of about 1.0 standard drink of alcohol per week. She reports that she does not use drugs.  PERFORMANCE STATUS: The patient's performance status is 1 - Symptomatic but completely ambulatory  PHYSICAL EXAM: Most Recent Vital Signs: There were no vitals taken for this visit. BP 124/74 (BP Location: Left Arm, Patient Position: Sitting)   Pulse 81   Temp 98 F (36.7 C) (Oral)   Resp 17   Ht 5\' 2"  (1.575 m)   Wt 134 lb 12.8 oz (61.1 kg)   SpO2 100%   BMI 24.66 kg/m   General Appearance:    Alert, cooperative, no distress, appears stated age  Head:    Normocephalic, without obvious abnormality, atraumatic  Eyes:    PERRL, conjunctiva/corneas clear, EOM's intact, fundi    benign, both eyes        Throat:   Lips, mucosa, and tongue normal; teeth and gums normal  Neck:   Supple, symmetrical, trachea midline, no adenopathy;    thyroid:  no enlargement/tenderness/nodules; no carotid   bruit or JVD  Back:     Symmetric, no curvature, ROM normal, no CVA tenderness  Lungs:     Clear to auscultation bilaterally, respirations unlabored  Chest Wall:    No tenderness or deformity   Heart:    Regular rate and rhythm, S1 and S2 normal, no murmur, rub   or gallop     Abdomen:     Soft, non-tender, bowel sounds active all four quadrants,    no masses, no organomegaly        Extremities:   Extremities normal, atraumatic, no cyanosis or edema  Pulses:   2+ and symmetric all extremities  Skin:   Skin color, texture, turgor normal, no rashes or lesions  Lymph nodes:   Cervical, supraclavicular, and axillary nodes normal   Neurologic:   CNII-XII intact, normal strength, sensation and reflexes    throughout    LABORATORY DATA:  Results for orders placed or performed in visit on 07/23/22 (from the past 48 hour(s))  CBC with Differential (Cancer Center Only)     Status: None   Collection Time: 07/23/22  2:29 PM  Result Value Ref Range   WBC Count 6.2 4.0 - 10.5 K/uL   RBC 4.66 3.87 - 5.11 MIL/uL   Hemoglobin 13.8 12.0 - 15.0 g/dL   HCT 16.1 09.6 - 04.5 %   MCV 91.4 80.0 - 100.0 fL   MCH 29.6 26.0 - 34.0  pg   MCHC 32.4 30.0 - 36.0 g/dL   RDW 16.1 09.6 - 04.5 %   Platelet Count 183 150 - 400 K/uL   nRBC 0.0 0.0 - 0.2 %   Neutrophils Relative % 64 %   Neutro Abs 4.0 1.7 - 7.7 K/uL   Lymphocytes Relative 25 %   Lymphs Abs 1.6 0.7 - 4.0 K/uL   Monocytes Relative 8 %   Monocytes Absolute 0.5 0.1 - 1.0 K/uL   Eosinophils Relative 2 %   Eosinophils Absolute 0.1 0.0 - 0.5 K/uL   Basophils Relative 1 %   Basophils Absolute 0.0 0.0 - 0.1 K/uL   Immature Granulocytes 0 %   Abs Immature Granulocytes 0.01 0.00 - 0.07 K/uL    Comment: Performed at [facility] at [facility], [address], [place], [place] [address]      RADIOGRAPHY: No results found.     PATHOLOGY: None  ASSESSMENT/PLAN: Ms. 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  We will set her up for 3 doses of IV iron.  We will also get a hyper coag panel at her follow-up to confirm antiphospholipid antibody syndrome.  Follow-up in 8 weeks.   All questions were answered. The patient knows to call the clinic with any problems, questions or concerns. We can certainly see the patient much sooner if necessary.  The patient was discussed with Dr. Myna Hidalgo and he is in agreement with the aforementioned.   Eileen Stanford, NP

## 2022-07-24 ENCOUNTER — Ambulatory Visit: Payer: 59 | Attending: Cardiology | Admitting: Cardiology

## 2022-07-24 ENCOUNTER — Encounter: Payer: Self-pay | Admitting: Cardiology

## 2022-07-24 VITALS — BP 92/64 | HR 85 | Ht 62.0 in | Wt 136.2 lb

## 2022-07-24 DIAGNOSIS — R42 Dizziness and giddiness: Secondary | ICD-10-CM | POA: Diagnosis not present

## 2022-07-24 DIAGNOSIS — R072 Precordial pain: Secondary | ICD-10-CM | POA: Diagnosis not present

## 2022-07-24 DIAGNOSIS — R002 Palpitations: Secondary | ICD-10-CM

## 2022-07-24 LAB — IRON AND IRON BINDING CAPACITY (CC-WL,HP ONLY)
Iron: 48 ug/dL (ref 28–170)
Saturation Ratios: 12 % (ref 10.4–31.8)
TIBC: 405 ug/dL (ref 250–450)
UIBC: 357 ug/dL (ref 148–442)

## 2022-07-24 NOTE — Patient Instructions (Signed)
  Testing/Procedures:  Your physician has requested that you have an echocardiogram. Echocardiography is a painless test that uses sound waves to create images of your heart. It provides your doctor with information about the size and shape of your heart and how well your heart's chambers and valves are working. This procedure takes approximately one hour. There are no restrictions for this procedure. Please do NOT wear cologne, perfume, aftershave, or lotions (deodorant is allowed). Please arrive 15 minutes prior to your appointment time. [address]   Follow-Up: At [facility], you and your health needs are our priority.  As part of our continuing mission to provide you with exceptional heart care, we have created designated Provider Care Teams.  These Care Teams include your primary Cardiologist (physician) and Advanced Practice Providers (APPs -  Physician Assistants and Nurse Practitioners) who all work together to provide you with the care you need, when you need it.  We recommend signing up for the patient portal called "MyChart".  Sign up information is provided on this After Visit Summary.  MyChart is used to connect with patients for Virtual Visits (Telemedicine).  Patients are able to view lab/test results, encounter notes, upcoming appointments, etc.  Non-urgent messages can be sent to your provider as well.   To learn more about what you can do with MyChart, go to [email or web address].    Your next appointment:   6 month(s)  Provider:   Brian Crenshaw, MD      

## 2022-07-25 ENCOUNTER — Encounter: Payer: Self-pay | Admitting: Family

## 2022-07-25 ENCOUNTER — Encounter: Payer: Self-pay | Admitting: *Deleted

## 2022-07-25 DIAGNOSIS — D509 Iron deficiency anemia, unspecified: Secondary | ICD-10-CM | POA: Insufficient documentation

## 2022-08-01 ENCOUNTER — Encounter: Payer: Self-pay | Admitting: Family

## 2022-08-01 ENCOUNTER — Other Ambulatory Visit: Payer: Self-pay | Admitting: *Deleted

## 2022-08-01 DIAGNOSIS — R760 Raised antibody titer: Secondary | ICD-10-CM

## 2022-08-01 DIAGNOSIS — R42 Dizziness and giddiness: Secondary | ICD-10-CM

## 2022-08-01 DIAGNOSIS — D500 Iron deficiency anemia secondary to blood loss (chronic): Secondary | ICD-10-CM

## 2022-08-04 ENCOUNTER — Inpatient Hospital Stay: Payer: 59 | Attending: Hematology & Oncology

## 2022-08-04 ENCOUNTER — Other Ambulatory Visit: Payer: Self-pay | Admitting: *Deleted

## 2022-08-04 ENCOUNTER — Inpatient Hospital Stay: Payer: 59

## 2022-08-04 ENCOUNTER — Other Ambulatory Visit: Payer: Self-pay | Admitting: Family

## 2022-08-04 ENCOUNTER — Other Ambulatory Visit: Payer: Self-pay | Admitting: Hematology & Oncology

## 2022-08-04 VITALS — BP 94/65 | HR 71 | Temp 98.3°F | Resp 17

## 2022-08-04 DIAGNOSIS — D500 Iron deficiency anemia secondary to blood loss (chronic): Secondary | ICD-10-CM

## 2022-08-04 DIAGNOSIS — R760 Raised antibody titer: Secondary | ICD-10-CM

## 2022-08-04 DIAGNOSIS — R768 Other specified abnormal immunological findings in serum: Secondary | ICD-10-CM

## 2022-08-04 DIAGNOSIS — D509 Iron deficiency anemia, unspecified: Secondary | ICD-10-CM | POA: Diagnosis present

## 2022-08-04 LAB — ANTITHROMBIN III: AntiThromb III Func: 105 % (ref 75–120)

## 2022-08-04 MED ORDER — SODIUM CHLORIDE 0.9 % IV SOLN
300.0000 mg | Freq: Once | INTRAVENOUS | Status: AC
  Administered 2022-08-04: 300 mg via INTRAVENOUS
  Filled 2022-08-04: qty 300

## 2022-08-04 MED ORDER — SODIUM CHLORIDE 0.9 % IV SOLN: Freq: Once | INTRAVENOUS | Status: AC

## 2022-08-04 NOTE — Addendum Note (Signed)
Addended by: Katheran Awe on: 08/04/2022 11:46 AM   Modules accepted: Orders

## 2022-08-04 NOTE — Patient Instructions (Signed)

## 2022-08-05 ENCOUNTER — Telehealth: Payer: Self-pay | Admitting: *Deleted

## 2022-08-05 LAB — LUPUS ANTICOAGULANT PANEL
DRVVT: 44.2 s (ref 0.0–47.0)
PTT Lupus Anticoagulant: 39.9 s (ref 0.0–43.5)

## 2022-08-05 LAB — PROTEIN C ACTIVITY: Protein C Activity: 102 % (ref 73–180)

## 2022-08-05 LAB — PROTEIN S, TOTAL: Protein S Ag, Total: 54 % — ABNORMAL LOW (ref 60–150)

## 2022-08-05 LAB — PROTEIN S ACTIVITY: Protein S Activity: 36 % — ABNORMAL LOW (ref 63–140)

## 2022-08-05 LAB — HOMOCYSTEINE: Homocysteine: 9.3 umol/L (ref 0.0–14.5)

## 2022-08-05 NOTE — Telephone Encounter (Signed)
Left patient a message to call and schedule a virtual visit with Dr. Berton Lan as a f/u of sonohyst.

## 2022-08-06 LAB — CARDIOLIPIN ANTIBODIES, IGG, IGM, IGA
Anticardiolipin IgA: <9 APL U/mL (ref 0–11)
Anticardiolipin IgG: 12 GPL U/mL (ref 0–14)
Anticardiolipin IgM: 21 MPL U/mL — ABNORMAL HIGH (ref 0–12)

## 2022-08-06 LAB — BETA-2-GLYCOPROTEIN I ABS, IGG/M/A
Beta-2 Glyco I IgG: <9 GPI IgG units (ref 0–20)
Beta-2-Glycoprotein I IgA: <9 GPI IgA units (ref 0–25)
Beta-2-Glycoprotein I IgM: 27 GPI IgM units (ref 0–32)

## 2022-08-06 LAB — PROTEIN C, TOTAL: Protein C, Total: 89 % (ref 60–150)

## 2022-08-07 ENCOUNTER — Inpatient Hospital Stay: Payer: 59

## 2022-08-11 ENCOUNTER — Inpatient Hospital Stay: Payer: 59

## 2022-08-11 VITALS — BP 105/68 | HR 77 | Temp 98.6°F | Resp 18

## 2022-08-11 DIAGNOSIS — D509 Iron deficiency anemia, unspecified: Secondary | ICD-10-CM | POA: Diagnosis not present

## 2022-08-11 DIAGNOSIS — D500 Iron deficiency anemia secondary to blood loss (chronic): Secondary | ICD-10-CM

## 2022-08-11 LAB — PROTHROMBIN GENE MUTATION

## 2022-08-11 MED ORDER — SODIUM CHLORIDE 0.9 % IV SOLN: Freq: Once | INTRAVENOUS | Status: AC

## 2022-08-11 MED ORDER — SODIUM CHLORIDE 0.9 % IV SOLN
300.0000 mg | Freq: Once | INTRAVENOUS | Status: AC
  Administered 2022-08-11: 300 mg via INTRAVENOUS
  Filled 2022-08-11: qty 300

## 2022-08-11 NOTE — Patient Instructions (Signed)

## 2022-08-14 ENCOUNTER — Inpatient Hospital Stay: Payer: 59

## 2022-08-18 ENCOUNTER — Inpatient Hospital Stay: Payer: 59

## 2022-08-18 VITALS — BP 94/69 | HR 82 | Temp 97.9°F | Resp 20

## 2022-08-18 DIAGNOSIS — D509 Iron deficiency anemia, unspecified: Secondary | ICD-10-CM | POA: Diagnosis not present

## 2022-08-18 DIAGNOSIS — D500 Iron deficiency anemia secondary to blood loss (chronic): Secondary | ICD-10-CM

## 2022-08-18 MED ORDER — SODIUM CHLORIDE 0.9 % IV SOLN
300.0000 mg | Freq: Once | INTRAVENOUS | Status: AC
  Administered 2022-08-18: 300 mg via INTRAVENOUS
  Filled 2022-08-18: qty 300

## 2022-08-18 MED ORDER — SODIUM CHLORIDE 0.9 % IV SOLN: Freq: Once | INTRAVENOUS | Status: AC

## 2022-08-18 NOTE — Patient Instructions (Signed)

## 2022-08-20 LAB — FACTOR 5 LEIDEN

## 2022-08-21 ENCOUNTER — Inpatient Hospital Stay: Payer: 59

## 2022-08-22 ENCOUNTER — Telehealth: Payer: Self-pay | Admitting: Family

## 2022-08-22 ENCOUNTER — Ambulatory Visit (HOSPITAL_COMMUNITY): Payer: 59 | Attending: Cardiology

## 2022-08-22 DIAGNOSIS — R002 Palpitations: Secondary | ICD-10-CM | POA: Insufficient documentation

## 2022-08-22 LAB — ECHOCARDIOGRAM COMPLETE
Area-P 1/2: 4.57 cm2
S' Lateral: 2.6 cm

## 2022-08-22 NOTE — Telephone Encounter (Signed)
I was able to speak with the patient and go over recent hyper coag panel results after reviewing with Dr. Myna Hidalgo. She does not have antiphospholipid antibody syndrome. She does have low protein S which could certainly influence a thrombotic event. She will start taking 1 EC baby aspirin daily with food. If she has to have surgery she will let us know along with her surgeon so we can bridge DVT prophylaxis if needed.  She has had her tubes tied.  She notes that she is feeling better since receiving IV iron. She continues to follow-up with her rheumatologist assessing for Lupus.  No questions or concerns at this time. Patient appreciative of call.

## 2022-09-24 ENCOUNTER — Encounter: Payer: Self-pay | Admitting: Family

## 2022-09-24 ENCOUNTER — Inpatient Hospital Stay: Payer: 59 | Attending: Hematology & Oncology

## 2022-09-24 ENCOUNTER — Inpatient Hospital Stay (HOSPITAL_BASED_OUTPATIENT_CLINIC_OR_DEPARTMENT_OTHER): Payer: 59 | Admitting: Family

## 2022-09-24 VITALS — BP 118/73 | HR 82 | Temp 98.2°F | Resp 17 | Wt 133.8 lb

## 2022-09-24 DIAGNOSIS — D500 Iron deficiency anemia secondary to blood loss (chronic): Secondary | ICD-10-CM

## 2022-09-24 DIAGNOSIS — E7212 Methylenetetrahydrofolate reductase deficiency: Secondary | ICD-10-CM | POA: Insufficient documentation

## 2022-09-24 DIAGNOSIS — D6859 Other primary thrombophilia: Secondary | ICD-10-CM

## 2022-09-24 DIAGNOSIS — E611 Iron deficiency: Secondary | ICD-10-CM | POA: Diagnosis present

## 2022-09-24 DIAGNOSIS — R760 Raised antibody titer: Secondary | ICD-10-CM

## 2022-09-24 LAB — CBC WITH DIFFERENTIAL (CANCER CENTER ONLY)
Abs Immature Granulocytes: 0.01 10*3/uL (ref 0.00–0.07)
Basophils Absolute: 0 10*3/uL (ref 0.0–0.1)
Basophils Relative: 0 %
Eosinophils Absolute: 0.1 10*3/uL (ref 0.0–0.5)
Eosinophils Relative: 1 %
HCT: 45.2 % (ref 36.0–46.0)
Hemoglobin: 14.6 g/dL (ref 12.0–15.0)
Immature Granulocytes: 0 %
Lymphocytes Relative: 21 %
Lymphs Abs: 1.2 10*3/uL (ref 0.7–4.0)
MCH: 30.2 pg (ref 26.0–34.0)
MCHC: 32.3 g/dL (ref 30.0–36.0)
MCV: 93.4 fL (ref 80.0–100.0)
Monocytes Absolute: 0.4 10*3/uL (ref 0.1–1.0)
Monocytes Relative: 7 %
Neutro Abs: 4.1 10*3/uL (ref 1.7–7.7)
Neutrophils Relative %: 71 %
Platelet Count: 194 10*3/uL (ref 150–400)
RBC: 4.84 MIL/uL (ref 3.87–5.11)
RDW: 12.5 % (ref 11.5–15.5)
WBC Count: 5.9 10*3/uL (ref 4.0–10.5)
nRBC: 0 % (ref 0.0–0.2)

## 2022-09-24 LAB — IRON AND IRON BINDING CAPACITY (CC-WL,HP ONLY)
Iron: 135 ug/dL (ref 28–170)
Saturation Ratios: 46 % — ABNORMAL HIGH (ref 10.4–31.8)
TIBC: 297 ug/dL (ref 250–450)
UIBC: 162 ug/dL (ref 148–442)

## 2022-09-24 LAB — RETICULOCYTES
Immature Retic Fract: 4.7 % (ref 2.3–15.9)
RBC.: 4.78 MIL/uL (ref 3.87–5.11)
Retic Count, Absolute: 55.9 10*3/uL (ref 19.0–186.0)
Retic Ct Pct: 1.2 % (ref 0.4–3.1)

## 2022-09-24 LAB — FERRITIN: Ferritin: 154 ng/mL (ref 11–307)

## 2022-09-24 NOTE — Progress Notes (Signed)
Hematology and Oncology Follow Up Visit  Jacqueline Huerta [id number] 1980-03-04 42 y.o. 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 She is doing well and feels like the IV iron has helped improve her energy.  She also received an injection with ENT for Meniere's that has helped her dizziness. She has noted a little recently.  Her cycle is irregular with heavy flow and spotting in between. No other blood loss noted. No bruising or petechiae.  No fever, chills, n/v, cough, rash, SOB, chest pain, palpitations, abdominal pain or changes in bowel or bladder habits. No swelling, tenderness, numbness or tingling in her extremities.  No falls or syncope reported.   Appetite and hydration are good. Weight is stable at 133 lbs.   ECOG Performance Status: 1 - Symptomatic but completely ambulatory  Medications:  Allergies as of 09/24/2022       Reactions   Nitrofurantoin Nausea And Vomiting   Amoxicillin Nausea Only        Medication List        Accurate as of September 24, 2022 10:21 AM. If you have any questions, ask your nurse or doctor.          aspirin EC 81 MG tablet Take 81 mg by mouth daily. Swallow whole.   HAIR SKIN & NAILS GUMMIES PO Hair Skin & Nails   ketoconazole 2 % shampoo Commonly known as: NIZORAL Apply topically 2 (two) times a week.   meclizine 25 MG tablet Commonly known as: ANTIVERT Take 1 tablet (25 mg total) by mouth 2 (two) times daily as needed for dizziness.   promethazine 25 MG tablet Commonly known as: PHENERGAN Take 1 tablet (25 mg total) by mouth every 6 (six) hours as needed for nausea (vertigo).   Vitamin D (Ergocalciferol) 1.25 MG (50000 UNIT) Caps capsule Commonly known as: DRISDOL Take 1 capsule  (50,000 Units total) by mouth every 7 (seven) days.        Allergies:  Allergies  Allergen Reactions   Nitrofurantoin Nausea And Vomiting   Amoxicillin Nausea Only    Past Medical History, Surgical history, Social history, and Family History were reviewed and updated.  Review of Systems: All other 10 point review of systems is negative.   Physical Exam:  weight is 133 lb 12.8 oz (60.7 kg). Her oral temperature is 98.2 F (36.8 C). Her blood pressure is 118/73 and her pulse is 82. Her respiration is 17 and oxygen saturation is 100%.   Wt Readings from Last 3 Encounters:  09/24/22 133 lb 12.8 oz (60.7 kg)  07/24/22 136 lb 3.2 oz (61.8 kg)  07/23/22 134 lb 12.8 oz (61.1 kg)    Ocular: Sclerae unicteric, pupils equal, round and reactive to light Ear-nose-throat: Oropharynx clear, dentition fair Lymphatic: No cervical or supraclavicular adenopathy Lungs no rales or rhonchi, good excursion bilaterally Heart regular rate and rhythm, no murmur appreciated Abd soft, nontender, positive bowel sounds MSK no focal spinal tenderness, no joint edema Neuro: non-focal, well-oriented, appropriate affect Breasts: Deferred   Lab Results  Component Value Date   WBC 5.9 09/24/2022   HGB 14.6 09/24/2022   HCT 45.2 09/24/2022   MCV 93.4 09/24/2022   PLT 194 09/24/2022   Lab Results  Component Value  Date   FERRITIN 10 (L) 07/23/2022   IRON 48 07/23/2022   TIBC 405 07/23/2022   UIBC 357 07/23/2022   IRONPCTSAT 12 07/23/2022   Lab Results  Component Value Date   RETICCTPCT 1.2 09/24/2022   RBC 4.84 09/24/2022   RBC 4.78 09/24/2022   No results found for: "KPAFRELGTCHN", "LAMBDASER", "KAPLAMBRATIO" No results found for: "IGGSERUM", "IGA", "IGMSERUM" No results found for: "TOTALPROTELP", "ALBUMINELP", "A1GS", "A2GS", "BETS", "BETA2SER", "GAMS", "MSPIKE", "SPEI"   Chemistry      Component Value Date/Time   NA 142 07/23/2022 1429   NA 141 02/20/2022 1542   K 4.2 07/23/2022 1429    CL 101 07/23/2022 1429   CO2 31 07/23/2022 1429   BUN 14 07/23/2022 1429   BUN 9 02/20/2022 1542   CREATININE 0.76 07/23/2022 1429   CREATININE 0.80 04/16/2018 1003      Component Value Date/Time   CALCIUM 9.6 07/23/2022 1429   ALKPHOS 56 07/23/2022 1429   AST 13 (L) 07/23/2022 1429   ALT 11 07/23/2022 1429   BILITOT 0.3 07/23/2022 1429       Impression and Plan: Jacqueline Huerta is a very pleasant 42 yo caucasian female with MTHFR mutation heterozygous 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  Iron studies are pending. We will replace if needed.  We can bridge her with Lovenox for any future major surgery if needed.  She will continue her baby aspirin daily.  Follow-up in 3 months.   Eileen Stanford, NP 7/31/202410:21 AM

## 2022-09-26 NOTE — Progress Notes (Deleted)
Office Visit Note  Patient: Jacqueline Huerta             Date of Birth: 1980-03-12           MRN: [id number]             PCP: Charlton Amor, DO Referring: Suzan Slick, MD Visit Date: 10/10/2022 Occupation: @GUAROCC @  Subjective:  No chief complaint on file.   History of Present Illness: Jacqueline Huerta is a 42 y.o. female ***     Activities of Daily Living:  Patient reports morning stiffness for *** {minute/hour:19697}.   Patient {ACTIONS;DENIES/REPORTS:21021675::"Denies"} nocturnal pain.  Difficulty dressing/grooming: {ACTIONS;DENIES/REPORTS:21021675::"Denies"} Difficulty climbing stairs: {ACTIONS;DENIES/REPORTS:21021675::"Denies"} Difficulty getting out of chair: {ACTIONS;DENIES/REPORTS:21021675::"Denies"} Difficulty using hands for taps, buttons, cutlery, and/or writing: {ACTIONS;DENIES/REPORTS:21021675::"Denies"}  No Rheumatology ROS completed.   PMFS History:  Patient Active Problem List   Diagnosis Date Noted   IDA (iron deficiency anemia) 07/25/2022   Ocular migraine 06/16/2022   Dizziness 06/16/2022   History of [facility] spotted fever 06/16/2022   Palpitations 05/19/2022   Generalized anxiety disorder 05/19/2022   Positive ANA (antinuclear antibody) 05/19/2022   Chronic fatigue 05/19/2022   Constipation 04/11/2022   Right upper quadrant pain 04/11/2022   Gastroesophageal reflux disease 04/11/2022   Family history of malignant neoplasm of digestive organs 04/11/2022   Abnormal levels of other serum enzymes 04/11/2022   Fever 10/09/2021   Need for follow-up care after discharge 10/09/2021   History of cesarean section 11/10/2019   Maternal care due to low transverse uterine scar from previous cesarean delivery 11/09/2019   Multigravida of advanced maternal age 89/04/2019   Epigastric pain 04/27/2018   Nausea without vomiting 04/27/2018   Tachycardia with heart rate 100-120 beats per minute 04/27/2018   Pelvic pain in female  04/21/2018   Menstrual cycle disorder 05/29/2017   History of bilateral tubal ligation 05/29/2017   Family history of colon cancer 04/17/2017   Atypical nevus of back 04/17/2017   History of abnormal cervical Pap smear    Breast tenderness in female     Past Medical History:  Diagnosis Date   Anxiety    Breast tenderness in female    Dysplastic nevus    History of abnormal cervical Pap smear     Family History  Problem Relation Age of Onset   Hyperlipidemia Mother    Alcohol abuse Father    Heart attack Maternal Grandfather    Colon cancer Paternal Grandmother    Lung cancer Maternal Aunt    Hypertension Other    Past Surgical History:  Procedure Laterality Date   BREAST ENHANCEMENT SURGERY     CESAREAN SECTION     x 2   CESAREAN SECTION WITH BILATERAL TUBAL LIGATION N/A 11/09/2019   Procedure: CESAREAN SECTION WITH BILATERAL TUBAL LIGATION;  Surgeon: Carlisle Cater, MD;  Location: [facility] LD ORS;  Service: Obstetrics;  Laterality: N/A;  request RNFA   LASER ABLATION OF THE CERVIX  2009   MOLE REMOVAL     TUBAL LIGATION     Social History   Social History Narrative   Not on file   Immunization History  Administered Date(s) Administered   Influenza Inj Mdck Quad Pf 04/06/2016   Tdap 08/31/2019, 10/27/2019     Objective: Vital Signs: There were no vitals taken for this visit.   Physical Exam   Musculoskeletal Exam: ***  CDAI Exam: CDAI Score: -- Patient Global: --; Provider Global: -- Swollen: --; Tender: -- Joint Exam 10/10/2022  No joint exam has been documented for this visit   There is currently no information documented on the homunculus. Go to the Rheumatology activity and complete the homunculus joint exam.  Investigation: No additional findings.  Imaging: No results found.  Recent Labs: Lab Results  Component Value Date   WBC 5.9 09/24/2022   HGB 14.6 09/24/2022   PLT 194 09/24/2022   NA 142 07/23/2022   K 4.2 07/23/2022   CL 101  07/23/2022   CO2 31 07/23/2022   GLUCOSE 103 (H) 07/23/2022   BUN 14 07/23/2022   CREATININE 0.76 07/23/2022   BILITOT 0.3 07/23/2022   ALKPHOS 56 07/23/2022   AST 13 (L) 07/23/2022   ALT 11 07/23/2022   PROT 6.7 07/23/2022   ALBUMIN 4.8 07/23/2022   CALCIUM 9.6 07/23/2022   GFRAA >60 11/09/2019    Speciality Comments: No specialty comments available.  Procedures:  No procedures performed Allergies: Nitrofurantoin and Amoxicillin   Assessment / Plan:     Visit Diagnoses: No diagnosis found.  Orders: No orders of the defined types were placed in this encounter.  No orders of the defined types were placed in this encounter.   Face-to-face time spent with patient was *** minutes. Greater than 50% of time was spent in counseling and coordination of care.  Follow-Up Instructions: No follow-ups on file.   Pollyann Savoy, MD  Note - This record has been created using Animal nutritionist.  Chart creation errors have been sought, but may not always  have been located. Such creation errors do not reflect on  the standard of medical care.

## 2022-09-30 ENCOUNTER — Telehealth: Payer: Self-pay

## 2022-09-30 NOTE — Telephone Encounter (Signed)
Patient called to check on lab results from last week as she is feeling bad this week, called and informed patient her iron results and other labs were good per sarah carter and to follow up with PCP. Pt confirmed and declines any other questions at this time.

## 2022-10-10 ENCOUNTER — Encounter: Payer: 59 | Admitting: Rheumatology

## 2022-10-16 ENCOUNTER — Telehealth (INDEPENDENT_AMBULATORY_CARE_PROVIDER_SITE_OTHER): Payer: 59 | Admitting: Obstetrics and Gynecology

## 2022-10-16 DIAGNOSIS — N923 Ovulation bleeding: Secondary | ICD-10-CM | POA: Diagnosis not present

## 2022-10-16 DIAGNOSIS — N939 Abnormal uterine and vaginal bleeding, unspecified: Secondary | ICD-10-CM | POA: Diagnosis not present

## 2022-10-16 NOTE — Progress Notes (Signed)
TELEHEALTH GYNECOLOGY VISIT ENCOUNTER NOTE  Provider location: Center for [facility] [place]   Patient location: Home  I connected with Jacqueline Huerta on 10/16/22 at  1:50 PM EDT by MyChart audiovisual encounter   I discussed the limitations, risks, security and privacy concerns of performing an evaluation and management service by telephone and the availability of in person appointments. I also discussed with the patient that there may be a patient responsible charge related to this service. The patient expressed understanding and agreed to proceed.  History:  Jacqueline Huerta is a 42 y.o. [phone] presenting as follow up for AUB  First saw pt 4/25 for 2 years of abnormal periods. Reports monthly cycles with heavy flow on the first 1-2 days followed by 2 weeks of light brown discharge that requires her to wear a tampon. This has persisted for the past 4 months.  Since I last saw her, she has confirmed diagnosis of protein S deficiency and MTHFR mutation. Her hematologist recommended daily ldASA and avoidance of hormonal contraception. She is following with rheumatologist.    I personally reviewed the following: TSH 1.84 on 05/19/22 fT4 1.35 on 05/19/22 Total T 36 on 04/10/22 Hgb 14.4 on 02/20/22 Pap NILM/HPV negative on 02/20/22 Sonohysterogram 07/16/22 - uterus 6.6 x 6.2 x 3.6cm w/ 8mm trilaminar endometrium, no intracavitary lesions, normal ovaries 7. Heme note 09/24/22 - protein S deficiency   PapHx - Remote hx abnormal paps requiring laser conization of cervix (~2007) 06/08/17 NILM/HPV neg 02/20/22 NILM/HPV neg  Past Medical History:  Diagnosis Date   Anxiety    Breast tenderness in female    Dysplastic nevus    History of abnormal cervical Pap smear    Past Surgical History:  Procedure Laterality Date   BREAST ENHANCEMENT SURGERY     CESAREAN SECTION     x 2   CESAREAN SECTION WITH BILATERAL TUBAL LIGATION N/A 11/09/2019   Procedure:  CESAREAN SECTION WITH BILATERAL TUBAL LIGATION;  Surgeon: Carlisle Cater, MD;  Location: [facility] LD ORS;  Service: Obstetrics;  Laterality: N/A;  request RNFA   LASER ABLATION OF THE CERVIX  2009   MOLE REMOVAL     TUBAL LIGATION     The following portions of the patient's history were reviewed and updated as appropriate: allergies, current medications, past family history, past medical history, past social history, past surgical history and problem list.    Review of Systems:  Pertinent items noted in HPI and remainder of comprehensive ROS otherwise negative.  Physical Exam:   General:  Alert, oriented and cooperative.   Mental Status: Normal mood and affect perceived. Normal judgment and thought content.  Physical exam deferred due to nature of the encounter  Assessment and Plan:  41yo with AUB  AUB - Discussed options for management including PO progestins, LNG IUD, endometrial ablation and hysterectomy. Reviewed r/b of each option.  - Reviewed need for endometrial biopsy if persistent bleeding on PO progestins/IUD or prior to ablation or hysterectomy - Pt would prefer to avoid any medical management, so I recommended endometrial sampling. She had issues with IUD placement in the past due to pain so declines office EMB - Reviewed r/b of hysteroscopy D&C with option for IUD placement at time of hysteroscopy. She would like to proceed with [facility] - Surgery request sent - Pre op testing per anesthesia - Minor surgery, does not need lovenox, ok to continue ldASA  I discussed the assessment and treatment plan with the patient.  The patient was provided an opportunity to ask questions and all were answered. The patient agreed with the plan and demonstrated an understanding of the instructions.   The patient was advised to call back or seek an in-person evaluation/go to the ED if the symptoms worsen or if the condition fails to improve as anticipated.  I provided 27 minutes of  non-face-to-face time during this encounter.  Lennart Pall, MD Center for [employer], [facility]

## 2022-11-04 ENCOUNTER — Telehealth: Payer: Self-pay

## 2022-11-04 ENCOUNTER — Ambulatory Visit: Payer: 59 | Admitting: Rheumatology

## 2022-11-04 NOTE — Telephone Encounter (Signed)
Called patient to see if she was available for surgery on 11/24/22 at 10:15 w/ Dr. Berton Lan? Patient says she is available but possibly on her menstrual cycle. Advised I would check w/ Dr. Berton Lan to see what she recommends before scheduling.

## 2022-11-06 ENCOUNTER — Telehealth: Payer: Self-pay

## 2022-11-06 NOTE — Telephone Encounter (Signed)
Called patient to advise she could have her surgery on 11/24/22 per Dr. Berton Lan. However, the available spot in the OR is now filled. Offered 12/24/22 and patient states her daughter has an appointment. I will call patient once the November schedule is available.

## 2022-11-12 ENCOUNTER — Ambulatory Visit
Admission: EM | Admit: 2022-11-12 | Discharge: 2022-11-12 | Disposition: A | Discharge status: Home / Self Care | Payer: 59 | Attending: Internal Medicine | Admitting: Internal Medicine

## 2022-11-12 ENCOUNTER — Other Ambulatory Visit: Payer: Self-pay

## 2022-11-12 DIAGNOSIS — L239 Allergic contact dermatitis, unspecified cause: Secondary | ICD-10-CM | POA: Diagnosis not present

## 2022-11-12 MED ORDER — PREDNISONE 20 MG PO TABS: 40.0000 mg | ORAL_TABLET | Freq: Every day | ORAL | 0 refills | Status: DC

## 2022-11-12 MED ORDER — METHYLPREDNISOLONE SODIUM SUCC 125 MG IJ SOLR
80.0000 mg | Freq: Once | INTRAMUSCULAR | Status: AC
  Administered 2022-11-12: 80 mg via INTRAMUSCULAR

## 2022-11-12 NOTE — ED Triage Notes (Signed)
Rash to neck, back, arms, legs x 3 days. Itches and burns.

## 2022-11-12 NOTE — ED Provider Notes (Signed)
[facility] Hu[facility]    CSN: [id number] Arrival date & time: 11/12/22  1649      History   Chief Complaint Chief Complaint  Patient presents with   Rash    HPI Jacqueline Huerta is a 42 y.o. female.   Jacqueline Huerta is a 42 y.o. female presenting for chief complaint of rash that started 3 days ago. Rash is itchy with burning sensation. Rash started to the right neck with what appear to be urticarial lesions and has now spread to the right arm, right leg, and left neck. No recent changes in laundry detergents, bedding, makeup, personal hygiene products, exposure to new medications/plants/foods, or recent sick contacts with similar rash. Denies recent known insect bites to area of rash. Denies throat closure sensation, voice changes, and fever/chills. Rash is intensely pruritic. No recent drainage/warmth from rash. She has not attempted use of any OTC medications to help with symptoms PTA. This type of rash has never happened before.      Past Medical History:  Diagnosis Date   Anxiety    Breast tenderness in female    Dysplastic nevus    History of abnormal cervical Pap smear     Patient Active Problem List   Diagnosis Date Noted   IDA (iron deficiency anemia) 07/25/2022   Ocular migraine 06/16/2022   Dizziness 06/16/2022   History of [facility] spotted fever 06/16/2022   Palpitations 05/19/2022   Generalized anxiety disorder 05/19/2022   Positive ANA (antinuclear antibody) 05/19/2022   Chronic fatigue 05/19/2022   Constipation 04/11/2022   Right upper quadrant pain 04/11/2022   Gastroesophageal reflux disease 04/11/2022   Family history of malignant neoplasm of digestive organs 04/11/2022   Abnormal levels of other serum enzymes 04/11/2022   Fever 10/09/2021   Need for follow-up care after discharge 10/09/2021   History of cesarean section 11/10/2019   Maternal care due to low transverse uterine scar from previous cesarean delivery  11/09/2019   Multigravida of advanced maternal age 72/04/2019   Epigastric pain 04/27/2018   Nausea without vomiting 04/27/2018   Tachycardia with heart rate 100-120 beats per minute 04/27/2018   Pelvic pain in female 04/21/2018   Menstrual cycle disorder 05/29/2017   History of bilateral tubal ligation 05/29/2017   Family history of colon cancer 04/17/2017   Atypical nevus of back 04/17/2017   History of abnormal cervical Pap smear    Breast tenderness in female     Past Surgical History:  Procedure Laterality Date   BREAST ENHANCEMENT SURGERY     CESAREAN SECTION     x 2   CESAREAN SECTION WITH BILATERAL TUBAL LIGATION N/A 11/09/2019   Procedure: CESAREAN SECTION WITH BILATERAL TUBAL LIGATION;  Surgeon: Carlisle Cater, MD;  Location: [facility] LD ORS;  Service: Obstetrics;  Laterality: N/A;  request RNFA   LASER ABLATION OF THE CERVIX  2009   MOLE REMOVAL     TUBAL LIGATION      OB History     Gravida  4   Para  3   Term  3   Preterm      AB  1   Living  3      SAB  1   IAB      Ectopic      Multiple  0   Live Births  2            Home Medications    Prior to Admission medications   Medication  Sig Start Date End Date Taking? Authorizing Provider  predniSONE (DELTASONE) 20 MG tablet Take 2 tablets (40 mg total) by mouth daily for 5 days. 11/12/22 11/17/22 Yes Carlisle Beers, FNP  aspirin EC 81 MG tablet Take 81 mg by mouth daily. Swallow whole.    [provider]  Biotin w/ Vitamins C & E (HAIR SKIN & NAILS GUMMIES PO) Hair Skin & Nails    [provider]  ketoconazole (NIZORAL) 2 % shampoo Apply topically 2 (two) times a week. Patient not taking: Reported on 09/24/2022 07/07/22   [provider]  meclizine (ANTIVERT) 25 MG tablet Take 1 tablet (25 mg total) by mouth 2 (two) times daily as needed for dizziness. Patient not taking: Reported on 07/24/2022 06/16/22   Charlton Amor, DO  promethazine (PHENERGAN) 25 MG tablet  Take 1 tablet (25 mg total) by mouth every 6 (six) hours as needed for nausea (vertigo). Patient not taking: Reported on 07/24/2022 07/11/22   Benjiman Core, MD  Vitamin D, Ergocalciferol, (DRISDOL) 1.25 MG (50000 UNIT) CAPS capsule Take 1 capsule (50,000 Units total) by mouth every 7 (seven) days. Patient not taking: Reported on 09/24/2022 04/21/22   Suzan Slick, MD    Family History Family History  Problem Relation Age of Onset   Hyperlipidemia Mother    Alcohol abuse Father    Heart attack Maternal Grandfather    Colon cancer Paternal Grandmother    Lung cancer Maternal Aunt    Hypertension Other     Social History Social History   Tobacco Use   Smoking status: Never   Smokeless tobacco: Never  Vaping Use   Vaping status: Never Used  Substance Use Topics   Alcohol use: Yes    Alcohol/week: 1.0 standard drink of alcohol    Types: 1 Standard drinks or equivalent per week    Comment: Rare   Drug use: Never     Allergies   Nitrofurantoin and Amoxicillin   Review of Systems Review of Systems Per HPI  Physical Exam Triage Vital Signs ED Triage Vitals  Encounter Vitals Group     BP 11/12/22 1653 115/77     Systolic BP Percentile --      Diastolic BP Percentile --      Pulse Rate 11/12/22 1653 85     Resp 11/12/22 1653 16     Temp 11/12/22 1653 97.8 F (36.6 C)     Temp src --      SpO2 11/12/22 1653 99 %     Weight --      Height --      Head Circumference --      Peak Flow --      Pain Score 11/12/22 1655 2     Pain Loc --      Pain Education --      Exclude from Growth Chart --    No data found.  Updated Vital Signs BP 115/77 (BP Location: Right Arm)   Pulse 85   Temp 97.8 F (36.6 C)   Resp 16   SpO2 99%   Visual Acuity Right Eye Distance:   Left Eye Distance:   Bilateral Distance:    Right Eye Near:   Left Eye Near:    Bilateral Near:     Physical Exam Vitals and nursing note reviewed.  Constitutional:      Appearance: She is  not ill-appearing or toxic-appearing.  HENT:     Head: Normocephalic and atraumatic.  Right Ear: Hearing and external ear normal.     Left Ear: Hearing and external ear normal.     Nose: Nose normal.     Mouth/Throat:     Lips: Pink.     Mouth: Mucous membranes are moist. No injury.     Tongue: No lesions. Tongue does not deviate from midline.     Palate: No mass and lesions.     Pharynx: Oropharynx is clear. Uvula midline. No pharyngeal swelling, oropharyngeal exudate, posterior oropharyngeal erythema or uvula swelling.     Tonsils: No tonsillar exudate or tonsillar abscesses.  Eyes:     General: Lids are normal. Vision grossly intact. Gaze aligned appropriately.     Extraocular Movements: Extraocular movements intact.     Conjunctiva/sclera: Conjunctivae normal.  Pulmonary:     Effort: Pulmonary effort is normal.  Musculoskeletal:     Cervical back: Neck supple.  Skin:    General: Skin is warm and dry.     Capillary Refill: Capillary refill takes less than 2 seconds.     Findings: Rash (Patchy urticarial lesions present to the right neck, dispersed erythematous papules (3-4) present to the right upper arm, right posterior knee, left neck, and left upper arm.) present.     Comments: See images below. No appreciable warmth or drainage to rash.  Neurological:     General: No focal deficit present.     Mental Status: She is alert and oriented to person, place, and time. Mental status is at baseline.     Cranial Nerves: No dysarthria or facial asymmetry.  Psychiatric:        Mood and Affect: Mood normal.        Speech: Speech normal.        Behavior: Behavior normal.        Thought Content: Thought content normal.        Judgment: Judgment normal.      UC Treatments / Results  Labs (all labs ordered are listed, but only abnormal results are displayed) Labs Reviewed - No data to display  EKG   Radiology No results found.  Procedures Procedures (including critical  care time)  Medications Ordered in UC Medications  methylPREDNISolone sodium succinate (SOLU-MEDROL) 125 mg/2 mL injection 80 mg (80 mg Intramuscular Given 11/12/22 1715)    Initial Impression / Assessment and Plan / UC Course  I have reviewed the triage vital signs and the nursing notes.  Pertinent labs & imaging results that were available during my care of the patient were reviewed by me and considered in my medical decision making (see chart for details).   1. Allergic contact dermatitis Presentation consistent with acute hypersensitivity reaction, likely acute allergic reaction. No signs of anaphylaxis, HEENT exam stable, lungs clear. Will treat with steroids, antihistamines, and H2 blocker famotidine. Solumedrol 80mg  IM given in clinic for acute symptoms. Prednisone burst 40 mg for 5 days to be taken with food starting tomorrow. No NSAIDs during use. Antihistamine and famotidine 20mg  once daily for 5-7 days. Advised to avoid known and potential allergens.   Counseled patient on potential for adverse effects with medications prescribed/recommended today, strict ER and return-to-clinic precautions discussed, patient verbalized understanding.    Final Clinical Impressions(s) / UC Diagnoses   Final diagnoses:  Allergic contact dermatitis, unspecified trigger     Discharge Instructions      You have been evaluated today for an allergic reaction. We gave you medicine to help with symptoms.   Take prednisone steroid sent  to pharmacy starting tomorrow as directed. You may take an over the counter antihistamine (Claritin or Zyrtec) for the next 5-7 days.  Please schedule an appointment with your primary care provider for follow-up and ongoing management. Return if you experience rashes, difficulty breathing or swallowing, lip/mouth/tongue swelling, vomiting, or for any other concerning symptoms. If symptoms are severe, please go to the ER for further workup.   ED Prescriptions      Medication Sig Dispense Auth. Provider   predniSONE (DELTASONE) 20 MG tablet Take 2 tablets (40 mg total) by mouth daily for 5 days. 10 tablet Carlisle Beers, FNP      PDMP not reviewed this encounter.   Carlisle Beers, [place] 11/12/22 1735

## 2022-11-12 NOTE — Discharge Instructions (Addendum)
You have been evaluated today for an allergic reaction. We gave you medicine to help with symptoms.   Take prednisone steroid sent to pharmacy starting tomorrow as directed. You may take an over the counter antihistamine (Claritin or Zyrtec) for the next 5-7 days.  Please schedule an appointment with your primary care provider for follow-up and ongoing management. Return if you experience rashes, difficulty breathing or swallowing, lip/mouth/tongue swelling, vomiting, or for any other concerning symptoms. If symptoms are severe, please go to the ER for further workup.

## 2022-11-17 ENCOUNTER — Other Ambulatory Visit: Payer: Self-pay

## 2022-11-17 ENCOUNTER — Encounter (HOSPITAL_BASED_OUTPATIENT_CLINIC_OR_DEPARTMENT_OTHER): Payer: Self-pay | Admitting: Obstetrics and Gynecology

## 2022-11-17 NOTE — Progress Notes (Addendum)
Spoke w/ via phone for pre-op interview---pt Lab needs dos----urine preg   surgery orders req dr Berton Lan epic ib      Lab results------ COVID test -----patient states asymptomatic no test needed Arrive at -------815 11-24-3022 NPO after MN NO Solid Food.  Clear liquids from MN until---715 Med rec completed Medications to take morning of surgery -----none Diabetic medication -----n/a Patient instructed no nail polish to be worn day of surgery Patient instructed to bring photo id and insurance card day of surgery Patient aware to have Driver (ride ) / caregiver brandon husband to stay in in waiting room with 62 year old child due to baby sitting issues (ok  per michael  nanney rn )   for 24 hours after surgery -  Patient Special Instructions -----none Pre-Op special Instructions -----none Patient verbalized understanding of instructions that were given at this phone interview. Patient denies chest pain, sob, fever, cough at the interview.   [facility] vascular dr Meredeth Ide 08-08-2022 f/u prn epic [facility]  cardiology dr Jens Som 07-24-2022 epic EKG 07-24-2022 epic Echo 08-22-2022 epic [facility] oncology sarah carter np 09-24-2022 Lov surgeon dr Berton Lan 10-16-2022 (ok to stay on 81 mg asa) epic, pt aware, last dose 81 mg asa to be 11-23-2022

## 2022-11-20 ENCOUNTER — Encounter: Payer: Self-pay | Admitting: Emergency Medicine

## 2022-11-20 ENCOUNTER — Ambulatory Visit
Admission: EM | Admit: 2022-11-20 | Discharge: 2022-11-20 | Disposition: A | Discharge status: Home / Self Care | Payer: 59 | Attending: Family Medicine | Admitting: Family Medicine

## 2022-11-20 ENCOUNTER — Other Ambulatory Visit: Payer: Self-pay | Admitting: Obstetrics and Gynecology

## 2022-11-20 DIAGNOSIS — L298 Other pruritus: Secondary | ICD-10-CM | POA: Diagnosis not present

## 2022-11-20 DIAGNOSIS — R21 Rash and other nonspecific skin eruption: Secondary | ICD-10-CM | POA: Diagnosis not present

## 2022-11-20 DIAGNOSIS — N939 Abnormal uterine and vaginal bleeding, unspecified: Secondary | ICD-10-CM

## 2022-11-20 MED ORDER — FEXOFENADINE HCL 180 MG PO TABS: 180.0000 mg | ORAL_TABLET | Freq: Every day | ORAL | 0 refills | Status: DC

## 2022-11-20 MED ORDER — PREDNISONE 10 MG (21) PO TBPK: ORAL_TABLET | Freq: Every day | ORAL | 0 refills | Status: DC

## 2022-11-20 NOTE — Discharge Instructions (Addendum)
Advised patient to take Allegra daily for the next 5 days for unspecified rash.  Advised patient may start prednisone course following surgical procedure on Monday, 11/24/2022.  Advised if symptoms worsen and/or unresolved please follow-up with PCP or dermatology for further evaluation.

## 2022-11-20 NOTE — ED Triage Notes (Signed)
Was seen and treated here for a rash a week ago. Given steroids with mild improvement. Rash and itching continues into today

## 2022-11-20 NOTE — ED Provider Notes (Signed)
Jacqueline Huerta CARE    CSN: [id number] Arrival date & time: 11/20/22  1831      History   Chief Complaint No chief complaint on file.   HPI Jacqueline Huerta is a 42 y.o. female.   HPI Pleasant 42 year old female presents with rash of right-sided neck and right shoulder.  PMH since significant for AUB, anxiety, and rash.  Patient was evaluated here on 11/12/2022 please see epic for that encounter note.  Patient is accompanied by her husband and her daughter this evening.  Past Medical History:  Diagnosis Date   Abnormal uterine bleeding (AUB)    Anemia    Anxiety    Dysplastic nevus    abnormal mole   History of abnormal cervical Pap smear    MTHFR mutation    PONV (postoperative nausea and vomiting)    Protein S deficiency (HCC)    Rash    back of arms and behind left leg healing well   Wears glasses     Patient Active Problem List   Diagnosis Date Noted   IDA (iron deficiency anemia) 07/25/2022   Ocular migraine 06/16/2022   Dizziness 06/16/2022   History of [facility] spotted fever 06/16/2022   Palpitations 05/19/2022   Generalized anxiety disorder 05/19/2022   Positive ANA (antinuclear antibody) 05/19/2022   Chronic fatigue 05/19/2022   Constipation 04/11/2022   Right upper quadrant pain 04/11/2022   Gastroesophageal reflux disease 04/11/2022   Family history of malignant neoplasm of digestive organs 04/11/2022   Abnormal levels of other serum enzymes 04/11/2022   Fever 10/09/2021   Need for follow-up care after discharge 10/09/2021   History of cesarean section 11/10/2019   Maternal care due to low transverse uterine scar from previous cesarean delivery 11/09/2019   Multigravida of advanced maternal age 31/04/2019   Epigastric pain 04/27/2018   Nausea without vomiting 04/27/2018   Tachycardia with heart rate 100-120 beats per minute 04/27/2018   Pelvic pain in female 04/21/2018   Menstrual cycle disorder 05/29/2017   History of bilateral  tubal ligation 05/29/2017   Family history of colon cancer 04/17/2017   Atypical nevus of back 04/17/2017   History of abnormal cervical Pap smear    Breast tenderness in female     Past Surgical History:  Procedure Laterality Date   BREAST ENHANCEMENT SURGERY     15 yrs ago   CESAREAN SECTION     x 2   CESAREAN SECTION WITH BILATERAL TUBAL LIGATION N/A 11/09/2019   Procedure: CESAREAN SECTION WITH BILATERAL TUBAL LIGATION;  Surgeon: Carlisle Cater, MD;  Location: [facility];  Service: Obstetrics;  Laterality: N/A;  request RNFA   LASER ABLATION OF THE CERVIX  2009   MOLE REMOVAL     TUBAL LIGATION      OB History     Gravida  4   Para  3   Term  3   Preterm      AB  1   Living  3      SAB  1   IAB      Ectopic      Multiple  0   Live Births  2            Home Medications    Prior to Admission medications   Medication Sig Start Date End Date Taking? Authorizing Provider  fexofenadine (ALLEGRA ALLERGY) 180 MG tablet Take 1 tablet (180 mg total) by mouth daily for 15 days. 11/20/22 12/05/22 Yes Morrison Mcbryar,  Casimiro Needle, FNP  predniSONE (STERAPRED UNI-PAK 21 TAB) 10 MG (21) TBPK tablet Take by mouth daily. Take 6 tabs by mouth daily  for 2 days, then 5 tabs for 2 days, then 4 tabs for 2 days, then 3 tabs for 2 days, 2 tabs for 2 days, then 1 tab by mouth daily for 2 days 11/20/22  Yes Trevor Iha, FNP  aspirin EC 81 MG tablet Take 81 mg by mouth daily. Swallow whole.    [provider]  Biotin w/ Vitamins C & E (HAIR SKIN & NAILS GUMMIES PO) Hair Skin & Nails    [provider]  ketoconazole (NIZORAL) 2 % shampoo Apply topically 2 (two) times a week. Patient not taking: Reported on 09/24/2022 07/07/22   [provider]    Family History Family History  Problem Relation Age of Onset   Hyperlipidemia Mother    Alcohol abuse Father    Heart attack Maternal Grandfather    Colon cancer Paternal Grandmother    Lung cancer Maternal Aunt     Hypertension Other     Social History Social History   Tobacco Use   Smoking status: Never   Smokeless tobacco: Never  Vaping Use   Vaping status: Never Used  Substance Use Topics   Alcohol use: Yes    Alcohol/week: 1.0 standard drink of alcohol    Types: 1 Standard drinks or equivalent per week    Comment: Rare   Drug use: Never     Allergies   Nitrofurantoin and Amoxicillin   Review of Systems Review of Systems  Skin:  Positive for rash.  All other systems reviewed and are negative.    Physical Exam Triage Vital Signs ED Triage Vitals  Encounter Vitals Group     BP      Systolic BP Percentile      Diastolic BP Percentile      Pulse      Resp      Temp      Temp src      SpO2      Weight      Height      Head Circumference      Peak Flow      Pain Score      Pain Loc      Pain Education      Exclude from Growth Chart    No data found.  Updated Vital Signs BP 109/75 (BP Location: Left Arm)   Pulse 88   Temp 97.7 F (36.5 C) (Oral)   Resp 16   LMP 11/13/2022   SpO2 100%    Physical Exam Vitals and nursing note reviewed.  Constitutional:      General: She is not in acute distress.    Appearance: Normal appearance. She is normal weight. She is not ill-appearing, toxic-appearing or diaphoretic.  HENT:     Head: Normocephalic and atraumatic.     Mouth/Throat:     Mouth: Mucous membranes are moist.     Pharynx: Oropharynx is clear.  Eyes:     Extraocular Movements: Extraocular movements intact.     Conjunctiva/sclera: Conjunctivae normal.     Pupils: Pupils are equal, round, and reactive to light.  Cardiovascular:     Rate and Rhythm: Normal rate and regular rhythm.     Pulses: Normal pulses.     Heart sounds: Normal heart sounds.  Pulmonary:     Effort: Pulmonary effort is normal.     Breath sounds: Normal  breath sounds. No wheezing, rhonchi or rales.  Musculoskeletal:        General: Normal range of motion.     Cervical back:  Normal range of motion and neck supple.  Skin:    General: Skin is warm and dry.     Comments: Neck (anterior aspect right-sided), right shoulder (anterior aspect), left leg (posterior aspect): Pruritic erythematous maculopapular eruption-please see images below  Neurological:     General: No focal deficit present.     Mental Status: She is alert and oriented to person, place, and time. Mental status is at baseline.  Psychiatric:        Mood and Affect: Mood normal.        Behavior: Behavior normal.        Thought Content: Thought content normal.         UC Treatments / Results  Labs (all labs ordered are listed, but only abnormal results are displayed) Labs Reviewed - No data to display  EKG   Radiology No results found.  Procedures Procedures (including critical care time)  Medications Ordered in UC Medications - No data to display  Initial Impression / Assessment and Plan / UC Course  I have reviewed the triage vital signs and the nursing notes.  Pertinent labs & imaging results that were available during my care of the patient were reviewed by me and considered in my medical decision making (see chart for details).     MDM: 1.  Rash and nonspecific skin eruption-Rx'd Allegra 180 mg fexofenadine without D daily x 5 days, then as needed for rash (will treat with histamine blocker to stabilize mast cells of skin); 2.  Pruritic erythematous rash-Rx'd Sterapred Unipak (tapering from 60 mg to 10 mg over 10 days) advised patient to start this medication on Tuesday, 11/25/2018 for 1 day following surgical procedure reported by patient this evening.  Advised if symptoms worsen and/or unresolved please follow-up with PCP or [facility] dermatology for further evaluation.  Contact information provided with his AVS this evening.  Patient discharged home, hemodynamically stable. Final Clinical Impressions(s) / UC Diagnoses   Final diagnoses:  Rash and nonspecific skin eruption   Pruritic erythematous rash     Discharge Instructions      Advised patient to take Allegra daily for the next 5 days for unspecified rash.  Advised patient may start prednisone course following surgical procedure on Monday, 11/24/2022.  Advised if symptoms worsen and/or unresolved please follow-up with PCP or dermatology for further evaluation.     ED Prescriptions     Medication Sig Dispense Auth. Provider   fexofenadine (ALLEGRA ALLERGY) 180 MG tablet Take 1 tablet (180 mg total) by mouth daily for 15 days. 15 tablet Trevor Iha, FNP   predniSONE (STERAPRED UNI-PAK 21 TAB) 10 MG (21) TBPK tablet Take by mouth daily. Take 6 tabs by mouth daily  for 2 days, then 5 tabs for 2 days, then 4 tabs for 2 days, then 3 tabs for 2 days, 2 tabs for 2 days, then 1 tab by mouth daily for 2 days 42 tablet Trevor Iha, FNP      PDMP not reviewed this encounter.   Trevor Iha, FNP 11/20/22 1926

## 2022-11-24 ENCOUNTER — Ambulatory Visit (HOSPITAL_COMMUNITY)
Admission: RE | Admit: 2022-11-24 | Discharge: 2022-11-24 | Disposition: A | Discharge status: Home / Self Care | Payer: 59 | Source: Ambulatory Visit | Attending: Obstetrics and Gynecology | Admitting: Obstetrics and Gynecology

## 2022-11-24 ENCOUNTER — Encounter (HOSPITAL_BASED_OUTPATIENT_CLINIC_OR_DEPARTMENT_OTHER)
Admission: RE | Disposition: A | Discharge status: Home / Self Care | Payer: Self-pay | Source: Ambulatory Visit | Attending: Obstetrics and Gynecology

## 2022-11-24 ENCOUNTER — Encounter (HOSPITAL_BASED_OUTPATIENT_CLINIC_OR_DEPARTMENT_OTHER): Payer: Self-pay | Admitting: Obstetrics and Gynecology

## 2022-11-24 ENCOUNTER — Ambulatory Visit (HOSPITAL_BASED_OUTPATIENT_CLINIC_OR_DEPARTMENT_OTHER): Payer: 59 | Admitting: Anesthesiology

## 2022-11-24 ENCOUNTER — Other Ambulatory Visit: Payer: Self-pay

## 2022-11-24 DIAGNOSIS — N938 Other specified abnormal uterine and vaginal bleeding: Secondary | ICD-10-CM | POA: Diagnosis not present

## 2022-11-24 DIAGNOSIS — N939 Abnormal uterine and vaginal bleeding, unspecified: Secondary | ICD-10-CM

## 2022-11-24 DIAGNOSIS — Z01818 Encounter for other preprocedural examination: Secondary | ICD-10-CM

## 2022-11-24 DIAGNOSIS — D6859 Other primary thrombophilia: Secondary | ICD-10-CM | POA: Insufficient documentation

## 2022-11-24 HISTORY — DX: Abnormal uterine and vaginal bleeding, unspecified: N93.9

## 2022-11-24 HISTORY — DX: Presence of spectacles and contact lenses: Z97.3

## 2022-11-24 HISTORY — DX: Anemia, unspecified: D64.9

## 2022-11-24 HISTORY — DX: Rash and other nonspecific skin eruption: R21

## 2022-11-24 HISTORY — PX: HYSTEROSCOPY WITH D & C: SHX1775

## 2022-11-24 HISTORY — DX: Genetic susceptibility to other disease: Z15.89

## 2022-11-24 HISTORY — DX: Other primary thrombophilia: D68.59

## 2022-11-24 HISTORY — DX: Other specified postprocedural states: R11.2

## 2022-11-24 HISTORY — DX: Nausea with vomiting, unspecified: Z98.890

## 2022-11-24 HISTORY — DX: Other specified postprocedural states: Z98.890

## 2022-11-24 LAB — POCT PREGNANCY, URINE: Preg Test, Ur: NEGATIVE

## 2022-11-24 SURGERY — DILATATION AND CURETTAGE /HYSTEROSCOPY
Anesthesia: General | Site: Uterus

## 2022-11-24 MED ORDER — LACTATED RINGERS IV SOLN: INTRAVENOUS | Status: DC

## 2022-11-24 MED ORDER — FENTANYL CITRATE (PF) 100 MCG/2ML IJ SOLN: 25.0000 ug | INTRAMUSCULAR | Status: DC | PRN

## 2022-11-24 MED ORDER — OXYCODONE HCL 5 MG/5ML PO SOLN: 5.0000 mg | Freq: Once | ORAL | Status: DC | PRN

## 2022-11-24 MED ORDER — PROPOFOL 500 MG/50ML IV EMUL
INTRAVENOUS | Status: DC | PRN
  Administered 2022-11-24: 25 ug/kg/min via INTRAVENOUS

## 2022-11-24 MED ORDER — MIDAZOLAM HCL 2 MG/2ML IJ SOLN
INTRAMUSCULAR | Status: AC
  Filled 2022-11-24: qty 2

## 2022-11-24 MED ORDER — ACETAMINOPHEN 500 MG PO TABS
1000.0000 mg | ORAL_TABLET | Freq: Once | ORAL | Status: AC
  Administered 2022-11-24: 1000 mg via ORAL

## 2022-11-24 MED ORDER — PHENYLEPHRINE HCL (PRESSORS) 10 MG/ML IV SOLN
INTRAVENOUS | Status: DC | PRN
Start: 2022-11-24 — End: 2022-11-24
  Administered 2022-11-24 (×2): 80 ug via INTRAVENOUS

## 2022-11-24 MED ORDER — SODIUM CHLORIDE 0.9 % IR SOLN
Status: DC | PRN
  Administered 2022-11-24: 3000 mL

## 2022-11-24 MED ORDER — FENTANYL CITRATE (PF) 100 MCG/2ML IJ SOLN
INTRAMUSCULAR | Status: AC
  Filled 2022-11-24: qty 2

## 2022-11-24 MED ORDER — LIDOCAINE HCL (PF) 1 % IJ SOLN
INTRAMUSCULAR | Status: DC | PRN
Start: 2022-11-24 — End: 2022-11-24
  Administered 2022-11-24: 20 mL

## 2022-11-24 MED ORDER — LIDOCAINE 2% (20 MG/ML) 5 ML SYRINGE
INTRAMUSCULAR | Status: DC | PRN
  Administered 2022-11-24: 60 mg via INTRAVENOUS

## 2022-11-24 MED ORDER — DEXAMETHASONE SODIUM PHOSPHATE 4 MG/ML IJ SOLN
INTRAMUSCULAR | Status: DC | PRN
  Administered 2022-11-24: 5 mg via INTRAVENOUS

## 2022-11-24 MED ORDER — OXYCODONE HCL 5 MG PO TABS: 5.0000 mg | ORAL_TABLET | Freq: Once | ORAL | Status: DC | PRN

## 2022-11-24 MED ORDER — PHENYLEPHRINE 80 MCG/ML (10ML) SYRINGE FOR IV PUSH (FOR BLOOD PRESSURE SUPPORT)
PREFILLED_SYRINGE | INTRAVENOUS | Status: AC
  Filled 2022-11-24: qty 10

## 2022-11-24 MED ORDER — FENTANYL CITRATE (PF) 100 MCG/2ML IJ SOLN
INTRAMUSCULAR | Status: DC | PRN
  Administered 2022-11-24: 25 ug via INTRAVENOUS
  Administered 2022-11-24: 50 ug via INTRAVENOUS
  Administered 2022-11-24: 25 ug via INTRAVENOUS

## 2022-11-24 MED ORDER — DEXAMETHASONE SODIUM PHOSPHATE 10 MG/ML IJ SOLN
INTRAMUSCULAR | Status: AC
  Filled 2022-11-24: qty 1

## 2022-11-24 MED ORDER — KETOROLAC TROMETHAMINE 30 MG/ML IJ SOLN: 30.0000 mg | Freq: Once | INTRAMUSCULAR | Status: DC | PRN

## 2022-11-24 MED ORDER — ACETAMINOPHEN 500 MG PO TABS
ORAL_TABLET | ORAL | Status: AC
  Filled 2022-11-24: qty 2

## 2022-11-24 MED ORDER — PROPOFOL 10 MG/ML IV BOLUS
INTRAVENOUS | Status: DC | PRN
Start: 2022-11-24 — End: 2022-11-24
  Administered 2022-11-24 (×2): 100 mg via INTRAVENOUS

## 2022-11-24 MED ORDER — LIDOCAINE HCL (PF) 2 % IJ SOLN
INTRAMUSCULAR | Status: AC
  Filled 2022-11-24: qty 5

## 2022-11-24 MED ORDER — KETOROLAC TROMETHAMINE 30 MG/ML IJ SOLN
INTRAMUSCULAR | Status: AC
  Filled 2022-11-24: qty 1

## 2022-11-24 MED ORDER — MIDAZOLAM HCL 5 MG/5ML IJ SOLN
INTRAMUSCULAR | Status: DC | PRN
  Administered 2022-11-24: 2 mg via INTRAVENOUS

## 2022-11-24 MED ORDER — ONDANSETRON HCL 4 MG/2ML IJ SOLN
INTRAMUSCULAR | Status: DC | PRN
  Administered 2022-11-24: 4 mg via INTRAVENOUS

## 2022-11-24 MED ORDER — ONDANSETRON HCL 4 MG/2ML IJ SOLN
INTRAMUSCULAR | Status: AC
  Filled 2022-11-24: qty 2

## 2022-11-24 MED ORDER — PROPOFOL 10 MG/ML IV BOLUS
INTRAVENOUS | Status: AC
  Filled 2022-11-24: qty 20

## 2022-11-24 MED ORDER — PROMETHAZINE HCL 25 MG/ML IJ SOLN: 6.2500 mg | INTRAMUSCULAR | Status: DC | PRN

## 2022-11-24 MED ORDER — AMISULPRIDE (ANTIEMETIC) 5 MG/2ML IV SOLN: 10.0000 mg | Freq: Once | INTRAVENOUS | Status: DC | PRN

## 2022-11-24 SURGICAL SUPPLY — 16 items
CATH ROBINSON RED A/P 16FR (CATHETERS) ×1 IMPLANT
ELECT REM PT RETURN 9FT ADLT (ELECTROSURGICAL)
ELECTRODE REM PT RTRN 9FT ADLT (ELECTROSURGICAL) IMPLANT
GLOVE BIO SURGEON STRL SZ7.5 (GLOVE) ×1 IMPLANT
GLOVE BIOGEL PI IND STRL 7.0 (GLOVE) ×1 IMPLANT
GLOVE SURG SS PI 6.5 STRL IVOR (GLOVE) IMPLANT
GOWN STRL REUS W/ TWL LRG LVL3 (GOWN DISPOSABLE) ×1 IMPLANT
GOWN STRL REUS W/ TWL XL LVL3 (GOWN DISPOSABLE) ×1 IMPLANT
GOWN STRL REUS W/TWL LRG LVL3 (GOWN DISPOSABLE) ×2
GOWN STRL REUS W/TWL XL LVL3 (GOWN DISPOSABLE) ×1
IV NS IRRIG 3000ML ARTHROMATIC (IV SOLUTION) IMPLANT
KIT PROCEDURE FLUENT (KITS) ×1 IMPLANT
PACK VAGINAL MINOR WOMEN LF (CUSTOM PROCEDURE TRAY) ×1 IMPLANT
PAD OB MATERNITY 4.3X12.25 (PERSONAL CARE ITEMS) ×1 IMPLANT
SEAL ROD LENS SCOPE MYOSURE (ABLATOR) IMPLANT
TOWEL OR 17X24 6PK STRL BLUE (TOWEL DISPOSABLE) IMPLANT

## 2022-11-24 NOTE — Progress Notes (Signed)
Post op anesthesia instructions printed separately and given to patient and her spouse with verbal instructions. Verbalized understanding.

## 2022-11-24 NOTE — Op Note (Signed)
11/24/22 11:04 AM  Preoperative Diagnosis: Abnormal uterine bleeding Postoperative Diagnosis: Same Procedure: hysteroscopy dilation and curettage  Surgeon: Dr. Harvie Bridge Assistant: None EBL 5 cc IVF: 400 cc  UOP: Not collected Deficit: 80cc  Specimens: endometrial curetting  Findings: Normal uterus. Sharp retroflexion of cervical canal with some minor scarring. Bilateral ostia visualized. No polyp or mass.   Description of the procedure: Preop antibiotics not indicated. Informed consent reviewed and signed. Pt given opportunity to ask questions.   Pt prepped and draped in the dorsal lithotomy fashion after LMA anesthesia found to be adequate. Timeout performed.   Open sided speculum placed into the patient's vagina. Single tooth tenaculum applied to the 12 o'clock position of the cervix. A paracervical block with 20cc of lidocaine 1% was performed. Cervix progressively dilated to a 19Fr. 30 degree hysteroscope was inserted into the cavity with the aforementioned findings noted. EMC performed.   Hemostatic at the end of the procedure. Procedure completed. All instruments removed. Counts correct x2.  Pt taken to recovery room in stable condition.  Harvie Bridge, MD Obstetrician & Gynecologist, [facility] for [employer], [facility]

## 2022-11-24 NOTE — Anesthesia Preprocedure Evaluation (Addendum)
Anesthesia Evaluation  Patient identified by MRN, date of birth, ID band Patient awake    Reviewed: Allergy & Precautions, NPO status , Patient's Chart, lab work & pertinent test results  History of Anesthesia Complications (+) PONV and history of anesthetic complications  Airway Mallampati: II  TM Distance: >3 FB Neck ROM: Full    Dental no notable dental hx.    Pulmonary neg pulmonary ROS   Pulmonary exam normal        Cardiovascular negative cardio ROS Normal cardiovascular exam     Neuro/Psych  Headaches PSYCHIATRIC DISORDERS Anxiety        GI/Hepatic negative GI ROS, Neg liver ROS,,,  Endo/Other  negative endocrine ROS    Renal/GU negative Renal ROS     Musculoskeletal negative musculoskeletal ROS (+)    Abdominal   Peds  Hematology negative hematology ROS (+)   Anesthesia Other Findings Abnormal uterine bleeding  Reproductive/Obstetrics Hcg negative                             Anesthesia Physical Anesthesia Plan  ASA: 1  Anesthesia Plan: General   Post-op Pain Management:    Induction: Intravenous  PONV Risk Score and Plan: 4 or greater and Ondansetron, Dexamethasone, Propofol infusion, Midazolam and Treatment may vary due to age or medical condition  Airway Management Planned: LMA  Additional Equipment:   Intra-op Plan:   Post-operative Plan: Extubation in OR  Informed Consent: I have reviewed the patients History and Physical, chart, labs and discussed the procedure including the risks, benefits and alternatives for the proposed anesthesia with the patient or authorized representative who has indicated his/her understanding and acceptance.     Dental advisory given  Plan Discussed with: CRNA  Anesthesia Plan Comments:        Anesthesia Quick Evaluation

## 2022-11-24 NOTE — H&P (Signed)
PRE OPERATIVE HISTORY AND PHYSICAL  Subjective:  Jacqueline Huerta is a 42 y.o. [phone] with AUB presenting for hysteroscopy dilation & curettage  First saw pt 4/25 for 2 years of abnormal periods. Reports monthly cycles with heavy flow on the first 1-2 days followed by 2 weeks of light brown discharge that requires her to wear a tampon. This has persisted for the past 4 months.   Since I last saw her, she has confirmed diagnosis of protein S deficiency and MTHFR mutation. Her hematologist recommended daily ldASA and avoidance of hormonal contraception. She is following with rheumatologist.   Today, she denies any updates to her medical history. She is feeling well and ready for surgery.    I personally reviewed the following: TSH 1.84 on 05/19/22 fT4 1.35 on 05/19/22 Total T 36 on 04/10/22 Hgb 14.4 on 02/20/22 Pap NILM/HPV negative on 02/20/22 Sonohysterogram 07/16/22 - uterus 6.6 x 6.2 x 3.6cm w/ 8mm trilaminar endometrium, no intracavitary lesions, normal ovaries 7. Heme note 09/24/22 - protein S deficiency   PapHx - Remote hx abnormal paps requiring laser conization of cervix (~2007) 06/08/17 NILM/HPV neg 02/20/22 NILM/HPV neg  Past Medical History:  Diagnosis Date   Abnormal uterine bleeding (AUB)    Anemia    Anxiety    Dysplastic nevus    abnormal mole   History of abnormal cervical Pap smear    MTHFR mutation    PONV (postoperative nausea and vomiting)    Protein S deficiency (HCC)    Rash    back of arms and behind left leg healing well   Wears glasses    Past Surgical History:  Procedure Laterality Date   BREAST ENHANCEMENT SURGERY     15 yrs ago   CESAREAN SECTION     x 2   CESAREAN SECTION WITH BILATERAL TUBAL LIGATION N/A 11/09/2019   Procedure: CESAREAN SECTION WITH BILATERAL TUBAL LIGATION;  Surgeon: Carlisle Cater, MD;  Location: [facility] LD ORS;  Service: Obstetrics;  Laterality: N/A;  request RNFA   LASER ABLATION OF THE CERVIX  2009   MOLE REMOVAL      TUBAL LIGATION     No current facility-administered medications on file prior to encounter.   Current Outpatient Medications on File Prior to Encounter  Medication Sig Dispense Refill   aspirin EC 81 MG tablet Take 81 mg by mouth daily. Swallow whole.     Biotin w/ Vitamins C & E (HAIR SKIN & NAILS GUMMIES PO) Hair Skin & Nails     ketoconazole (NIZORAL) 2 % shampoo Apply topically 2 (two) times a week. (Patient not taking: Reported on 09/24/2022)     Allergies  Allergen Reactions   Nitrofurantoin Nausea And Vomiting   Amoxicillin Nausea Only   OB History     Gravida  4   Para  3   Term  3   Preterm      AB  1   Living  3      SAB  1   IAB      Ectopic      Multiple  0   Live Births  2          Social History   Socioeconomic History   Marital status: Married    Spouse name: Not on file   Number of children: 3   Years of education: Not on file   Highest education level: Not on file  Occupational History   Not on file  Tobacco  Use   Smoking status: Never   Smokeless tobacco: Never  Vaping Use   Vaping status: Never Used  Substance and Sexual Activity   Alcohol use: Yes    Alcohol/week: 1.0 standard drink of alcohol    Types: 1 Standard drinks or equivalent per week    Comment: Rare   Drug use: Never   Sexual activity: Yes    Birth control/protection: Surgical  Other Topics Concern   Not on file  Social History Narrative   Not on file   Social Determinants of Health   Financial Resource Strain: Low Risk  (07/18/2022)   Received from [facility], [facility]   Overall Financial Resource Strain (CARDIA)    Difficulty of Paying Living Expenses: Not hard at all  Food Insecurity: No Food Insecurity (07/23/2022)   Hunger Vital Sign    Worried About Running Out of Food in the Last Year: Never true    Ran Out of Food in the Last Year: Never true  Transportation Needs: No Transportation Needs (07/18/2022)   Received from [employer],  [facility]   PRAPARE - Transportation    Lack of Transportation (Medical): No    Lack of Transportation (Non-Medical): No  Physical Activity: Not on file  Stress: Not on file  Social Connections: Unknown (06/27/2021)   Received from [facility], [facility]   Social Network    Social Network: Not on file  Intimate Partner Violence: Not At Risk (07/23/2022)   Humiliation, Afraid, Rape, and Kick questionnaire    Fear of Current or Ex-Partner: No    Emotionally Abused: No    Physically Abused: No    Sexually Abused: No   Objective:   Vitals:   11/17/22 1120 11/24/22 0809  BP:  107/69  Pulse:  74  Resp:  17  Temp:  98 F (36.7 C)  TempSrc:  Oral  SpO2:  100%  Weight: 61.2 kg 61.6 kg  Height: 5\' 2"  (1.575 m) 5\' 2"  (1.575 m)   General:  Alert, oriented and cooperative. Patient is in no acute distress.  Skin: Skin is warm and dry. No rash noted.   Cardiovascular: Normal heart rate noted  Respiratory: Normal respiratory effort, no problems with respiration noted  Abdomen: Soft, non-tender, non-distended    Assessment and Plan:  Jacqueline Huerta is a 42 y.o. with AUB presenting for hysteroscopy dilation & curettage for endometrial sampling  AUB - Presents today for hysteroscopy D&C for endometrial sampling for refractory AUB. Previously discussed office EMB which the patient declined - Risks of surgery include but are not limited to: bleeding, infection, injury to surrounding organs/tissues (i.e. bowel/bladder/ureters), need for additional procedures, wound complications, hospital re-admission, VTE, additional complications: Uterine perforation, electrolyte derangements, fluid overload - We discussed postop restrictions, precautions and expectations - All questions answered - Consents signed - To OR when ready, no pre op antibiotics indicated  Future Appointments  Date Time Provider Department Center  12/08/2022  2:15 PM [facility] None  12/08/2022  2:30  PM Erenest Blank, NP [facility] None  02/24/2023  8:50 AM Charlton Amor, DO [facility] None   Lennart Pall, MD

## 2022-11-24 NOTE — Transfer of Care (Addendum)
Immediate Anesthesia Transfer of Care Note  Patient: Jacqueline Huerta  Procedure(s) Performed: Procedure(s) (LRB): DILATATION AND CURETTAGE /HYSTEROSCOPY (N/A)  Patient Location: PACU  Anesthesia Type: GA  Level of Consciousness: awake, sedated, patient cooperative and responds to stimulation  Airway & Oxygen Therapy: Patient Spontanous Breathing and Patient connected to [place] oxygen  Post-op Assessment: Report given to PACU RN, Post -op Vital signs reviewed and stable and Patient moving all extremities  Post vital signs: Reviewed and stable  Complications: No apparent anesthesia complications

## 2022-11-24 NOTE — Anesthesia Postprocedure Evaluation (Signed)
Anesthesia Post Note  Patient: Jacqueline Huerta  Procedure(s) Performed: DILATATION AND CURETTAGE /HYSTEROSCOPY (Uterus)     Patient location during evaluation: PACU Anesthesia Type: General Level of consciousness: awake Pain management: pain level controlled Vital Signs Assessment: post-procedure vital signs reviewed and stable Respiratory status: spontaneous breathing, nonlabored ventilation and respiratory function stable Cardiovascular status: blood pressure returned to baseline and stable Postop Assessment: no apparent nausea or vomiting Anesthetic complications: no   No notable events documented.  Last Vitals:  Vitals:   11/24/22 1200 11/24/22 1232  BP: 98/66 99/69  Pulse: 65 68  Resp: 14 15  Temp:  36.8 C  SpO2: 98% 99%    Last Pain:  Vitals:   11/24/22 1232  TempSrc: Oral  PainSc: 1                  Jakerria Kingbird P Jalei Shibley

## 2022-11-24 NOTE — Anesthesia Procedure Notes (Signed)
Procedure Name: LMA Insertion Date/Time: 11/24/2022 10:33 AM  Performed by: Jessica Priest, CRNAPre-anesthesia Checklist: Patient identified, Emergency Drugs available, Suction available, Patient being monitored and Timeout performed Patient Re-evaluated:Patient Re-evaluated prior to induction Oxygen Delivery Method: Circle system utilized Preoxygenation: Pre-oxygenation with 100% oxygen Induction Type: IV induction Ventilation: Mask ventilation without difficulty LMA: LMA inserted LMA Size: 4.0 Number of attempts: 1 Airway Equipment and Method: Bite block Placement Confirmation: positive ETCO2, breath sounds checked- equal and bilateral and CO2 detector Tube secured with: Tape Dental Injury: Teeth and Oropharynx as per pre-operative assessment

## 2022-11-24 NOTE — Discharge Instructions (Addendum)
  Post Anesthesia Home Care Instructions  Activity: Get plenty of rest for the remainder of the day. A responsible individual must stay with you for 24 hours following the procedure.  For the next 24 hours, DO NOT: -Drive a car -Advertising copywriter -Drink alcoholic beverages -Take any medication unless instructed by your physician -Make any legal decisions or sign important papers.  Meals: Start with liquid foods such as gelatin or soup. Progress to regular foods as tolerated. Avoid greasy, spicy, heavy foods. If nausea and/or vomiting occur, drink only clear liquids until the nausea and/or vomiting subsides. Call your physician if vomiting continues.  Special Instructions/Symptoms: Your throat may feel dry or sore from the anesthesia or the breathing tube placed in your throat during surgery. If this causes discomfort, gargle with warm salt water. The discomfort should disappear within 24 hours.  If you had a scopolamine patch placed behind your ear for the management of post- operative nausea and/or vomiting:  1. The medication in the patch is effective for 72 hours, after which it should be removed.  Wrap patch in a tissue and discard in the trash. Wash hands thoroughly with soap and water. 2. You may remove the patch earlier than 72 hours if you experience unpleasant side effects which may include dry mouth, dizziness or visual disturbances. 3. Avoid touching the patch. Wash your hands with soap and water after contact with the patch.   No tylenol until after 2:30 p.m.

## 2022-11-25 LAB — SURGICAL PATHOLOGY

## 2022-11-26 ENCOUNTER — Encounter (HOSPITAL_BASED_OUTPATIENT_CLINIC_OR_DEPARTMENT_OTHER): Payer: Self-pay | Admitting: Obstetrics and Gynecology

## 2022-12-05 ENCOUNTER — Other Ambulatory Visit: Payer: Self-pay | Admitting: *Deleted

## 2022-12-05 DIAGNOSIS — D500 Iron deficiency anemia secondary to blood loss (chronic): Secondary | ICD-10-CM

## 2022-12-05 DIAGNOSIS — R760 Raised antibody titer: Secondary | ICD-10-CM

## 2022-12-05 DIAGNOSIS — D6859 Other primary thrombophilia: Secondary | ICD-10-CM

## 2022-12-08 ENCOUNTER — Inpatient Hospital Stay: Payer: 59 | Attending: Hematology & Oncology

## 2022-12-08 ENCOUNTER — Inpatient Hospital Stay (HOSPITAL_BASED_OUTPATIENT_CLINIC_OR_DEPARTMENT_OTHER): Payer: 59 | Admitting: Medical Oncology

## 2022-12-08 ENCOUNTER — Other Ambulatory Visit: Payer: Self-pay

## 2022-12-08 ENCOUNTER — Encounter: Payer: Self-pay | Admitting: Medical Oncology

## 2022-12-08 VITALS — BP 109/88 | HR 89 | Temp 97.8°F | Resp 18 | Ht 62.0 in | Wt 136.0 lb

## 2022-12-08 DIAGNOSIS — D500 Iron deficiency anemia secondary to blood loss (chronic): Secondary | ICD-10-CM

## 2022-12-08 DIAGNOSIS — E611 Iron deficiency: Secondary | ICD-10-CM | POA: Diagnosis present

## 2022-12-08 DIAGNOSIS — R760 Raised antibody titer: Secondary | ICD-10-CM

## 2022-12-08 DIAGNOSIS — E7212 Methylenetetrahydrofolate reductase deficiency: Secondary | ICD-10-CM | POA: Diagnosis not present

## 2022-12-08 DIAGNOSIS — D6859 Other primary thrombophilia: Secondary | ICD-10-CM

## 2022-12-08 LAB — CBC WITH DIFFERENTIAL (CANCER CENTER ONLY)
Abs Immature Granulocytes: 0.05 10*3/uL (ref 0.00–0.07)
Basophils Absolute: 0 10*3/uL (ref 0.0–0.1)
Basophils Relative: 0 %
Eosinophils Absolute: 0.2 10*3/uL (ref 0.0–0.5)
Eosinophils Relative: 2 %
HCT: 44.7 % (ref 36.0–46.0)
Hemoglobin: 14.4 g/dL (ref 12.0–15.0)
Immature Granulocytes: 1 %
Lymphocytes Relative: 25 %
Lymphs Abs: 2.2 10*3/uL (ref 0.7–4.0)
MCH: 30.4 pg (ref 26.0–34.0)
MCHC: 32.2 g/dL (ref 30.0–36.0)
MCV: 94.5 fL (ref 80.0–100.0)
Monocytes Absolute: 0.7 10*3/uL (ref 0.1–1.0)
Monocytes Relative: 8 %
Neutro Abs: 5.6 10*3/uL (ref 1.7–7.7)
Neutrophils Relative %: 64 %
Platelet Count: 232 10*3/uL (ref 150–400)
RBC: 4.73 MIL/uL (ref 3.87–5.11)
RDW: 11.7 % (ref 11.5–15.5)
WBC Count: 8.9 10*3/uL (ref 4.0–10.5)
nRBC: 0 % (ref 0.0–0.2)

## 2022-12-08 LAB — RETICULOCYTES
Immature Retic Fract: 2.3 % (ref 2.3–15.9)
RBC.: 4.57 MIL/uL (ref 3.87–5.11)
Retic Count, Absolute: 69.5 10*3/uL (ref 19.0–186.0)
Retic Ct Pct: 1.5 % (ref 0.4–3.1)

## 2022-12-08 LAB — FERRITIN: Ferritin: 131 ng/mL (ref 11–307)

## 2022-12-08 NOTE — Progress Notes (Signed)
Hematology and Oncology Follow Up Visit  Jacqueline Huerta [id number] 1980-09-26 42 y.o. 12/08/2022   Principle Diagnosis:  MTHFR mutation heterozygous for the A1298C variant  Low protein S History of 1 miscarriage  Iron deficiency   Current Therapy:   EC 81 mg aspirin PO daily IV iron as indicated - Venofer- last dose 08/18/2022   Interim History: Jacqueline Huerta is here today for follow-up.  She is doing well and feels like the IV iron has helped improve her energy.  She has been on steroids for 2 weeks due to itching so energy has been good.  Had a D&C and hystoscopy last month. No other blood loss noted. No bruising or petechiae.  No fever, chills, n/v, cough, rash, SOB, chest pain, palpitations, abdominal pain or changes in bowel or bladder habits. No swelling, tenderness, numbness or tingling in her extremities.  No falls or syncope reported.   Appetite and hydration are good.    ECOG Performance Status: 1 - Symptomatic but completely ambulatory  Medications:  Allergies as of 12/08/2022       Reactions   Amoxicillin Nausea Only   Nitrofurantoin Nausea And Vomiting        Medication List        Accurate as of December 08, 2022  2:53 PM. If you have any questions, ask your nurse or doctor.          STOP taking these medications    predniSONE 10 MG (21) Tbpk tablet Commonly known as: STERAPRED UNI-PAK 21 TAB Stopped by: Rushie Chestnut       TAKE these medications    aspirin EC 81 MG tablet Take 81 mg by mouth daily. Swallow whole.   fexofenadine 180 MG tablet Commonly known as: Allegra Allergy Take 1 tablet (180 mg total) by mouth daily for 15 days.   HAIR SKIN & NAILS GUMMIES PO Hair Skin & Nails   ketoconazole 2 % shampoo Commonly known as: NIZORAL Apply 1 Application topically 2 (two) times a week.   Vitamin D (Ergocalciferol) 1.25 MG (50000 UNIT) Caps capsule Commonly known as: DRISDOL Take 50,000 Units by mouth once a week.         Allergies:  Allergies  Allergen Reactions   Amoxicillin Nausea Only   Nitrofurantoin Nausea And Vomiting    Past Medical History, Surgical history, Social history, and Family History were reviewed and updated.  Review of Systems: All other 10 point review of systems is negative.   Physical Exam:  height is 5\' 2"  (1.575 m) and weight is 136 lb (61.7 kg). Her oral temperature is 97.8 F (36.6 C). Her blood pressure is 109/88 and her pulse is 89. Her respiration is 18 and oxygen saturation is 100%.   Wt Readings from Last 3 Encounters:  12/08/22 136 lb (61.7 kg)  11/24/22 135 lb 11.2 oz (61.6 kg)  09/24/22 133 lb 12.8 oz (60.7 kg)    Ocular: Sclerae unicteric, pupils equal, round and reactive to light Ear-nose-throat: Oropharynx clear, dentition fair Lymphatic: No cervical or supraclavicular adenopathy Lungs no rales or rhonchi, good excursion bilaterally Heart regular rate and rhythm, no murmur appreciated Abd soft, nontender, positive bowel sounds MSK no focal spinal tenderness, no joint edema Neuro: non-focal, well-oriented, appropriate affect   Lab Results  Component Value Date   WBC 8.9 12/08/2022   HGB 14.4 12/08/2022   HCT 44.7 12/08/2022   MCV 94.5 12/08/2022   PLT 232 12/08/2022   Lab Results  Component Value Date  FERRITIN 154 09/24/2022   IRON 135 09/24/2022   TIBC 297 09/24/2022   UIBC 162 09/24/2022   IRONPCTSAT 46 (H) 09/24/2022   Lab Results  Component Value Date   RETICCTPCT 1.5 12/08/2022   RBC 4.57 12/08/2022   No results found for: "KPAFRELGTCHN", "LAMBDASER", "KAPLAMBRATIO" No results found for: "IGGSERUM", "IGA", "IGMSERUM" No results found for: "TOTALPROTELP", "ALBUMINELP", "A1GS", "A2GS", "BETS", "BETA2SER", "GAMS", "MSPIKE", "SPEI"   Chemistry      Component Value Date/Time   NA 142 07/23/2022 1429   NA 141 02/20/2022 1542   K 4.2 07/23/2022 1429   CL 101 07/23/2022 1429   CO2 31 07/23/2022 1429   BUN 14 07/23/2022 1429    BUN 9 02/20/2022 1542   CREATININE 0.76 07/23/2022 1429   CREATININE 0.80 04/16/2018 1003      Component Value Date/Time   CALCIUM 9.6 07/23/2022 1429   ALKPHOS 56 07/23/2022 1429   AST 13 (L) 07/23/2022 1429   ALT 11 07/23/2022 1429   BILITOT 0.3 07/23/2022 1429      Encounter Diagnoses  Name Primary?   Iron deficiency anemia due to chronic blood loss Yes   Protein S deficiency (HCC)    Antiphospholipid antibody positive     Impression and Plan: Jacqueline Huerta is a very pleasant 42 yo caucasian female with MTHFR mutation heterozygous 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   She is currently on daily 81 mg asa. Bridge with Lovenox in the future if needed for any future major surgery.   CBC looks good today.  Iron studies are pending. We will replace if needed.  Follow-up in 3 months.   Rushie Chestnut, PA-C 10/14/20242:53 PM

## 2022-12-09 LAB — PROTEIN S, TOTAL: Protein S Ag, Total: 47 % — ABNORMAL LOW (ref 60–150)

## 2022-12-09 LAB — PROTEIN S ACTIVITY: Protein S Activity: 31 % — ABNORMAL LOW (ref 63–140)

## 2022-12-09 LAB — IRON AND IRON BINDING CAPACITY (CC-WL,HP ONLY)
Iron: 60 ug/dL (ref 28–170)
Saturation Ratios: 18 % (ref 10.4–31.8)
TIBC: 330 ug/dL (ref 250–450)
UIBC: 270 ug/dL (ref 148–442)

## 2022-12-25 ENCOUNTER — Encounter: Payer: 59 | Admitting: Obstetrics and Gynecology

## 2022-12-30 ENCOUNTER — Encounter: Payer: Self-pay | Admitting: Medical Oncology

## 2022-12-30 ENCOUNTER — Inpatient Hospital Stay: Payer: 59 | Admitting: Medical Oncology

## 2022-12-30 ENCOUNTER — Inpatient Hospital Stay: Payer: 59 | Attending: Hematology & Oncology

## 2022-12-30 VITALS — BP 109/73 | HR 87 | Temp 97.9°F | Resp 17 | Wt 139.0 lb

## 2022-12-30 DIAGNOSIS — R760 Raised antibody titer: Secondary | ICD-10-CM

## 2022-12-30 DIAGNOSIS — D500 Iron deficiency anemia secondary to blood loss (chronic): Secondary | ICD-10-CM | POA: Diagnosis not present

## 2022-12-30 DIAGNOSIS — D6859 Other primary thrombophilia: Secondary | ICD-10-CM | POA: Diagnosis not present

## 2022-12-30 DIAGNOSIS — Z79899 Other long term (current) drug therapy: Secondary | ICD-10-CM | POA: Diagnosis not present

## 2022-12-30 DIAGNOSIS — E611 Iron deficiency: Secondary | ICD-10-CM | POA: Insufficient documentation

## 2022-12-30 LAB — CBC
HCT: 43.1 % (ref 36.0–46.0)
Hemoglobin: 14.2 g/dL (ref 12.0–15.0)
MCH: 30.8 pg (ref 26.0–34.0)
MCHC: 32.9 g/dL (ref 30.0–36.0)
MCV: 93.5 fL (ref 80.0–100.0)
Platelets: 235 10*3/uL (ref 150–400)
RBC: 4.61 MIL/uL (ref 3.87–5.11)
RDW: 11.9 % (ref 11.5–15.5)
WBC: 6.4 10*3/uL (ref 4.0–10.5)
nRBC: 0 % (ref 0.0–0.2)

## 2022-12-30 LAB — RETICULOCYTES
Immature Retic Fract: 7.8 % (ref 2.3–15.9)
RBC.: 4.6 MIL/uL (ref 3.87–5.11)
Retic Count, Absolute: 65.8 10*3/uL (ref 19.0–186.0)
Retic Ct Pct: 1.4 % (ref 0.4–3.1)

## 2022-12-30 LAB — FERRITIN: Ferritin: 86 ng/mL (ref 11–307)

## 2022-12-30 NOTE — Progress Notes (Signed)
Hematology and Oncology Follow Up Visit  Jacqueline Huerta [id number] 1980-05-09 42 y.o. 12/30/2022   Principle Diagnosis:  MTHFR mutation heterozygous for the A1298C variant  Low protein S History of 1 miscarriage  Iron deficiency   Current Therapy:   EC 81 mg aspirin PO daily IV iron as indicated - Venofer- last dose 08/18/2022   Interim History: Jacqueline Huerta is here today for follow-up.    She reports that she has been doing well other than her hives. She is followed closely by dermatology for this.  Menstrual cycles have lessened but she still has some spotting.  No bruising or petechiae.  No fever, chills, n/v, cough, rash, SOB, chest pain, palpitations, abdominal pain or changes in bowel or bladder habits. No swelling, tenderness, numbness or tingling in her extremities.  No falls or syncope reported.   Appetite and hydration are good.    ECOG Performance Status: 1 - Symptomatic but completely ambulatory  Medications:  Allergies as of 12/30/2022       Reactions   Amoxicillin Nausea Only   Nitrofurantoin Nausea And Vomiting        Medication List        Accurate as of December 30, 2022  3:26 PM. If you have any questions, ask your nurse or doctor.          aspirin EC 81 MG tablet Take 81 mg by mouth daily. Swallow whole.   fexofenadine 180 MG tablet Commonly known as: Allegra Allergy Take 1 tablet (180 mg total) by mouth daily for 15 days.   HAIR SKIN & NAILS GUMMIES PO Hair Skin & Nails   ketoconazole 2 % shampoo Commonly known as: NIZORAL Apply 1 Application topically 2 (two) times a week.   Vitamin D (Ergocalciferol) 1.25 MG (50000 UNIT) Caps capsule Commonly known as: DRISDOL Take 50,000 Units by mouth once a week.        Allergies:  Allergies  Allergen Reactions   Amoxicillin Nausea Only   Nitrofurantoin Nausea And Vomiting    Past Medical History, Surgical history, Social history, and Family History were reviewed and  updated.  Review of Systems: All other 10 point review of systems is negative.   Physical Exam:  weight is 139 lb (63 kg). Her oral temperature is 97.9 F (36.6 C). Her blood pressure is 109/73 and her pulse is 87. Her respiration is 17.   Wt Readings from Last 3 Encounters:  12/30/22 139 lb (63 kg)  12/08/22 136 lb (61.7 kg)  11/24/22 135 lb 11.2 oz (61.6 kg)    Ocular: Sclerae unicteric, pupils equal, round and reactive to light Ear-nose-throat: Oropharynx clear, dentition fair Lymphatic: No cervical or supraclavicular adenopathy Lungs no rales or rhonchi, good excursion bilaterally Heart regular rate and rhythm, no murmur appreciated Abd soft, nontender, positive bowel sounds MSK no focal spinal tenderness, no joint edema Neuro: non-focal, well-oriented, appropriate affect   Lab Results  Component Value Date   WBC 6.4 12/30/2022   HGB 14.2 12/30/2022   HCT 43.1 12/30/2022   MCV 93.5 12/30/2022   PLT 235 12/30/2022   Lab Results  Component Value Date   FERRITIN 131 12/08/2022   IRON 60 12/08/2022   TIBC 330 12/08/2022   UIBC 270 12/08/2022   IRONPCTSAT 18 12/08/2022   Lab Results  Component Value Date   RETICCTPCT 1.4 12/30/2022   RBC 4.60 12/30/2022   RBC 4.61 12/30/2022   No results found for: "KPAFRELGTCHN", "LAMBDASER", "KAPLAMBRATIO" No results found for: "IGGSERUM", "  IGA", "IGMSERUM" No results found for: "TOTALPROTELP", "ALBUMINELP", "A1GS", "A2GS", "BETS", "BETA2SER", "GAMS", "MSPIKE", "SPEI"   Chemistry      Component Value Date/Time   NA 142 07/23/2022 1429   NA 141 02/20/2022 1542   K 4.2 07/23/2022 1429   CL 101 07/23/2022 1429   CO2 31 07/23/2022 1429   BUN 14 07/23/2022 1429   BUN 9 02/20/2022 1542   CREATININE 0.76 07/23/2022 1429   CREATININE 0.80 04/16/2018 1003      Component Value Date/Time   CALCIUM 9.6 07/23/2022 1429   ALKPHOS 56 07/23/2022 1429   AST 13 (L) 07/23/2022 1429   ALT 11 07/23/2022 1429   BILITOT 0.3 07/23/2022  1429      Encounter Diagnoses  Name Primary?   Iron deficiency anemia due to chronic blood loss Yes   Protein S deficiency (HCC)    Antiphospholipid antibody positive     Impression and Plan: Jacqueline Huerta is a very pleasant 42 yo caucasian female with MTHFR mutation heterozygous 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   We are going to repeat labs for potential antiphospholipid syndrome and will repeat her protein S labs.   CBC looks good today.  Adding Antiphospholipid syndrome labs for next time (not able to add to todays labs) Iron studies are pending. We will replace if needed.   RTC 3 months APP, labs (CBC, iron, ferritin, antiphospholipid panel)-[place]  Rushie Chestnut, PA-C 11/5/20243:26 PM

## 2022-12-31 ENCOUNTER — Inpatient Hospital Stay: Payer: 59

## 2022-12-31 DIAGNOSIS — R760 Raised antibody titer: Secondary | ICD-10-CM

## 2022-12-31 DIAGNOSIS — E611 Iron deficiency: Secondary | ICD-10-CM | POA: Diagnosis not present

## 2022-12-31 LAB — IRON AND IRON BINDING CAPACITY (CC-WL,HP ONLY)
Iron: 65 ug/dL (ref 28–170)
Saturation Ratios: 18 % (ref 10.4–31.8)
TIBC: 360 ug/dL (ref 250–450)
UIBC: 295 ug/dL (ref 148–442)

## 2023-01-01 LAB — PROTEIN S ACTIVITY: Protein S Activity: 42 % — ABNORMAL LOW (ref 63–140)

## 2023-01-01 LAB — PROTEIN S, TOTAL: Protein S Ag, Total: 58 % — ABNORMAL LOW (ref 60–150)

## 2023-01-03 LAB — ANTIPHOSPHOLIPID SYNDROME EVAL, BLD
Anticardiolipin IgA: <9 U/mL (ref 0–11)
Anticardiolipin IgG: <9 [GPL'U]/mL (ref 0–14)
Anticardiolipin IgM: 16 [MPL'U]/mL — ABNORMAL HIGH (ref 0–12)
DRVVT: 40 s (ref 0.0–47.0)
PTT Lupus Anticoagulant: 37.9 s (ref 0.0–43.5)
Phosphatydalserine, IgA: 1 {APS'U} (ref 0–19)
Phosphatydalserine, IgG: 9 U (ref 0–30)
Phosphatydalserine, IgM: 32 U — ABNORMAL HIGH (ref 0–30)

## 2023-01-28 ENCOUNTER — Ambulatory Visit: Payer: 59 | Admitting: Cardiology

## 2023-02-24 ENCOUNTER — Encounter: Payer: 59 | Admitting: Family Medicine

## 2023-02-24 ENCOUNTER — Inpatient Hospital Stay: Payer: 59 | Attending: Hematology & Oncology

## 2023-02-24 DIAGNOSIS — E611 Iron deficiency: Secondary | ICD-10-CM | POA: Diagnosis present

## 2023-02-24 DIAGNOSIS — D6859 Other primary thrombophilia: Secondary | ICD-10-CM | POA: Insufficient documentation

## 2023-02-24 DIAGNOSIS — D500 Iron deficiency anemia secondary to blood loss (chronic): Secondary | ICD-10-CM

## 2023-02-24 LAB — RETIC PANEL
Immature Retic Fract: 6.9 % (ref 2.3–15.9)
RBC.: 4.91 MIL/uL (ref 3.87–5.11)
Retic Count, Absolute: 57.4 10*3/uL (ref 19.0–186.0)
Retic Ct Pct: 1.2 % (ref 0.4–3.1)
Reticulocyte Hemoglobin: 32.5 pg (ref >27.9)

## 2023-02-24 LAB — IRON AND IRON BINDING CAPACITY (CC-WL,HP ONLY)
Iron: 114 ug/dL (ref 28–170)
Saturation Ratios: 33 % — ABNORMAL HIGH (ref 10.4–31.8)
TIBC: 350 ug/dL (ref 250–450)
UIBC: 236 ug/dL (ref 148–442)

## 2023-02-24 LAB — CBC
HCT: 44.9 % (ref 36.0–46.0)
Hemoglobin: 14.8 g/dL (ref 12.0–15.0)
MCH: 30.6 pg (ref 26.0–34.0)
MCHC: 33 g/dL (ref 30.0–36.0)
MCV: 93 fL (ref 80.0–100.0)
Platelets: 197 10*3/uL (ref 150–400)
RBC: 4.83 MIL/uL (ref 3.87–5.11)
RDW: 11.9 % (ref 11.5–15.5)
WBC: 5.9 10*3/uL (ref 4.0–10.5)
nRBC: 0 % (ref 0.0–0.2)

## 2023-02-24 LAB — FERRITIN: Ferritin: 74 ng/mL (ref 11–307)

## 2023-02-26 ENCOUNTER — Other Ambulatory Visit: Payer: Self-pay | Admitting: Medical Oncology

## 2023-02-26 ENCOUNTER — Encounter: Payer: Self-pay | Admitting: Medical Oncology

## 2023-02-26 MED ORDER — FOLIC ACID 1 MG PO TABS: 1.0000 mg | ORAL_TABLET | Freq: Every day | ORAL | 12 refills | Status: AC

## 2023-04-01 ENCOUNTER — Inpatient Hospital Stay: Payer: 59 | Admitting: Medical Oncology

## 2023-04-01 ENCOUNTER — Inpatient Hospital Stay: Payer: 59 | Attending: Hematology & Oncology

## 2023-04-01 VITALS — BP 117/79 | HR 84 | Temp 97.7°F | Resp 17 | Wt 136.0 lb

## 2023-04-01 DIAGNOSIS — E7212 Methylenetetrahydrofolate reductase deficiency: Secondary | ICD-10-CM | POA: Diagnosis not present

## 2023-04-01 DIAGNOSIS — D500 Iron deficiency anemia secondary to blood loss (chronic): Secondary | ICD-10-CM

## 2023-04-01 DIAGNOSIS — E611 Iron deficiency: Secondary | ICD-10-CM | POA: Diagnosis present

## 2023-04-01 DIAGNOSIS — R760 Raised antibody titer: Secondary | ICD-10-CM

## 2023-04-01 DIAGNOSIS — D6859 Other primary thrombophilia: Secondary | ICD-10-CM | POA: Insufficient documentation

## 2023-04-01 LAB — RETICULOCYTES
Immature Retic Fract: 5.7 % (ref 2.3–15.9)
RBC.: 4.66 MIL/uL (ref 3.87–5.11)
Retic Count, Absolute: 43.8 10*3/uL (ref 19.0–186.0)
Retic Ct Pct: 0.9 % (ref 0.4–3.1)

## 2023-04-01 LAB — CBC WITH DIFFERENTIAL (CANCER CENTER ONLY)
Abs Immature Granulocytes: 0.01 10*3/uL (ref 0.00–0.07)
Basophils Absolute: 0 10*3/uL (ref 0.0–0.1)
Basophils Relative: 0 %
Eosinophils Absolute: 0.2 10*3/uL (ref 0.0–0.5)
Eosinophils Relative: 2 %
HCT: 43.3 % (ref 36.0–46.0)
Hemoglobin: 14.1 g/dL (ref 12.0–15.0)
Immature Granulocytes: 0 %
Lymphocytes Relative: 22 %
Lymphs Abs: 1.5 10*3/uL (ref 0.7–4.0)
MCH: 30.3 pg (ref 26.0–34.0)
MCHC: 32.6 g/dL (ref 30.0–36.0)
MCV: 92.9 fL (ref 80.0–100.0)
Monocytes Absolute: 0.5 10*3/uL (ref 0.1–1.0)
Monocytes Relative: 8 %
Neutro Abs: 4.7 10*3/uL (ref 1.7–7.7)
Neutrophils Relative %: 68 %
Platelet Count: 208 10*3/uL (ref 150–400)
RBC: 4.66 MIL/uL (ref 3.87–5.11)
RDW: 11.8 % (ref 11.5–15.5)
WBC Count: 6.9 10*3/uL (ref 4.0–10.5)
nRBC: 0 % (ref 0.0–0.2)

## 2023-04-01 LAB — FERRITIN: Ferritin: 72 ng/mL (ref 11–307)

## 2023-04-01 NOTE — Progress Notes (Signed)
 Hematology and Oncology Follow Up Visit  Jacqueline Huerta [id number] 1980/08/26 43 y.o. 04/01/2023   Principle Diagnosis:  MTHFR mutation heterozygous for the A1298C variant  Low protein S History of 1 miscarriage  Iron  deficiency   Current Therapy:   EC 81 mg aspirin PO daily IV iron  as indicated - Venofer - last dose 08/18/2022   Interim History: Jacqueline Huerta is here today for follow-up.   Today she states that she has been doing ok. Menstrual cycles are heavier following her D&C. Spotting after has improved.  No bruising or petechiae.  No fever, chills, n/v, cough, rash, SOB, chest pain, palpitations, abdominal pain or changes in bowel or bladder habits. No swelling, tenderness, numbness or tingling in her extremities.  No falls or syncope reported.   Appetite and hydration are good.   No family history of clotting diseases.   ECOG Performance Status: 1 - Symptomatic but completely ambulatory  Medications:  Allergies as of 04/01/2023       Reactions   Amoxicillin  Nausea Only   Nitrofurantoin Nausea And Vomiting        Medication List        Accurate as of April 01, 2023  3:51 PM. If you have any questions, ask your nurse or doctor.          aspirin EC 81 MG tablet Take 81 mg by mouth daily. Swallow whole.   folic acid  1 MG tablet Commonly known as: FOLVITE  Take 1 tablet (1 mg total) by mouth daily.   HAIR SKIN & NAILS GUMMIES PO Hair Skin & Nails   ketoconazole 2 % shampoo Commonly known as: NIZORAL Apply 1 Application topically 2 (two) times a week.   Vitamin D  (Ergocalciferol ) 1.25 MG (50000 UNIT) Caps capsule Commonly known as: DRISDOL  Take 50,000 Units by mouth once a week.        Allergies:  Allergies  Allergen Reactions   Amoxicillin  Nausea Only   Nitrofurantoin Nausea And Vomiting    Past Medical History, Surgical history, Social history, and Family History were reviewed and updated.  Review of Systems: All other 10  point review of systems is negative.   Physical Exam:  weight is 136 lb (61.7 kg). Her oral temperature is 97.7 F (36.5 C). Her blood pressure is 117/79 and her pulse is 84. Her respiration is 17 and oxygen saturation is 100%.   Wt Readings from Last 3 Encounters:  04/01/23 136 lb (61.7 kg)  12/30/22 139 lb (63 kg)  12/08/22 136 lb (61.7 kg)    Ocular: Sclerae unicteric, pupils equal, round and reactive to light Ear-nose-throat: Oropharynx clear, dentition fair Lymphatic: No cervical or supraclavicular adenopathy Lungs no rales or rhonchi, good excursion bilaterally Heart regular rate and rhythm, no murmur appreciated Abd soft, nontender, positive bowel sounds MSK no focal spinal tenderness, no joint edema Neuro: non-focal, well-oriented, appropriate affect   Lab Results  Component Value Date   WBC 6.9 04/01/2023   HGB 14.1 04/01/2023   HCT 43.3 04/01/2023   MCV 92.9 04/01/2023   PLT 208 04/01/2023   Lab Results  Component Value Date   FERRITIN 74 02/24/2023   IRON  114 02/24/2023   TIBC 350 02/24/2023   UIBC 236 02/24/2023   IRONPCTSAT 33 (H) 02/24/2023   Lab Results  Component Value Date   RETICCTPCT 0.9 04/01/2023   RBC 4.66 04/01/2023   No results found for: KPAFRELGTCHN, LAMBDASER, KAPLAMBRATIO No results found for: IGGSERUM, IGA, IGMSERUM No results found for: TOTALPROTELP, ALBUMINELP, A1GS,  MARKEL EARLA JOANNIE DOC VICK, SPEI   Chemistry      Component Value Date/Time   NA 142 07/23/2022 1429   NA 141 02/20/2022 1542   K 4.2 07/23/2022 1429   CL 101 07/23/2022 1429   CO2 31 07/23/2022 1429   BUN 14 07/23/2022 1429   BUN 9 02/20/2022 1542   CREATININE 0.76 07/23/2022 1429   CREATININE 0.80 04/16/2018 1003      Component Value Date/Time   CALCIUM 9.6 07/23/2022 1429   ALKPHOS 56 07/23/2022 1429   AST 13 (L) 07/23/2022 1429   ALT 11 07/23/2022 1429   BILITOT 0.3 07/23/2022 1429      Encounter Diagnoses  Name  Primary?   Iron  deficiency anemia due to chronic blood loss Yes   Protein S deficiency (HCC)    Antiphospholipid antibody positive    Impression and Plan: Jacqueline Huerta is a very pleasant 43 yo caucasian female with MTHFR mutation heterozygous for the A1298C variant, protein S deficiency and iron  deficiency.   Hgb today is nice and stable at 14.1 Repeat protein S labs pending today.   Iron  studies are pending. We will replace if needed.   RTC 6 months APP, labs (CBC, iron , ferritin)-[place]  Lauraine CHRISTELLA Dais, PA-C 2/5/20253:51 PM

## 2023-04-02 LAB — IRON AND IRON BINDING CAPACITY (CC-WL,HP ONLY)
Iron: 58 ug/dL (ref 28–170)
Saturation Ratios: 17 % (ref 10.4–31.8)
TIBC: 346 ug/dL (ref 250–450)
UIBC: 288 ug/dL (ref 148–442)

## 2023-04-03 LAB — PROTEIN S, TOTAL: Protein S Ag, Total: 53 % — ABNORMAL LOW (ref 60–150)

## 2023-04-03 LAB — PROTEIN S ACTIVITY: Protein S Activity: 33 % — ABNORMAL LOW (ref 63–140)

## 2023-04-07 ENCOUNTER — Encounter: Payer: Self-pay | Admitting: Medical Oncology

## 2023-04-30 ENCOUNTER — Ambulatory Visit
Admission: EM | Admit: 2023-04-30 | Discharge: 2023-04-30 | Disposition: A | Discharge status: Home / Self Care | Attending: Emergency Medicine | Admitting: Emergency Medicine

## 2023-04-30 ENCOUNTER — Encounter: Payer: Self-pay | Admitting: Emergency Medicine

## 2023-04-30 DIAGNOSIS — R21 Rash and other nonspecific skin eruption: Secondary | ICD-10-CM | POA: Diagnosis not present

## 2023-04-30 MED ORDER — PREDNISONE 10 MG (21) PO TBPK: ORAL_TABLET | Freq: Every day | ORAL | 0 refills | Status: AC

## 2023-04-30 MED ORDER — HYDROXYZINE HCL 25 MG PO TABS: 25.0000 mg | ORAL_TABLET | Freq: Four times a day (QID) | ORAL | 0 refills | Status: AC | PRN

## 2023-04-30 NOTE — ED Triage Notes (Signed)
 Patient c/o possible hives on chest, bottoms of feet, palms of hands, neck, groin area.  Burning itching sensation.  Under a great deal of stress unsure if its anxiety or not.  Denies taken any medication.

## 2023-04-30 NOTE — ED Provider Notes (Signed)
 [facility]    CSN: [id number] Arrival date & time: 04/30/23  1809      History   Chief Complaint Chief Complaint  Patient presents with   Rash    HPI Jacqueline Huerta is a 43 y.o. female.   Patient presents to clinic over concern of an itchy rash that is present to her chest, neck, palms, soles of her feet, groin and lower abdomen area.  The itchy rash developed last night.  She has not tried any interventions for this.  She had this happen one time during her pregnancy when she was very stressed.  Had a happen in the fall 2024 as well.  Did have some relief with the longer steroid taper.  Seems to be related to anxiety.  She has not changed any soaps, lotions or detergents.  She did bring some hydroxyzine with her, has not wanted to take this because it may be expired as the bottle is 41 years old.  This did help with intermittent anxiety in the past when she had it during pregnancy.  Has tried Lexapro, discontinued this after a day due to medication side effects.  Recently had an experience with her 69-year-old daughter where her daughter had a traumatic experience at the dentist, and this is caused a lot of anxiety for her.   Denies wheezing, shortness of breath, trouble swallowing or trouble breathing.  The history is provided by the patient and medical records.  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   Dizziness 06/16/2022   History  of [facility] spotted fever 06/16/2022   Palpitations 05/19/2022   Generalized anxiety disorder 05/19/2022   Positive ANA (antinuclear antibody) 05/19/2022   Chronic fatigue 05/19/2022   Constipation 04/11/2022   Right upper quadrant pain 04/11/2022   Gastroesophageal reflux disease 04/11/2022   Family history of malignant neoplasm of digestive organs 04/11/2022   Abnormal levels of other serum enzymes 04/11/2022   Fever 10/09/2021   Need for follow-up care after discharge 10/09/2021   History of cesarean section 11/10/2019   Maternal care due to low transverse uterine scar from previous cesarean delivery 11/09/2019   Multigravida of advanced maternal age 37/04/2019   Epigastric pain 04/27/2018   Nausea without vomiting 04/27/2018   Tachycardia with heart rate 100-120 beats per minute 04/27/2018   Pelvic pain in female 04/21/2018   Menstrual cycle disorder 05/29/2017   History of bilateral tubal ligation 05/29/2017   Family history of colon cancer 04/17/2017   Atypical nevus of back 04/17/2017   History of abnormal cervical Pap smear    Breast tenderness in female     Past Surgical History:  Procedure Laterality Date   BREAST ENHANCEMENT SURGERY     15 yrs ago   CESAREAN SECTION     x 2   CESAREAN SECTION WITH BILATERAL TUBAL  LIGATION N/A 11/09/2019   Procedure: CESAREAN SECTION WITH BILATERAL TUBAL LIGATION;  Surgeon: Carlisle Cater, MD;  Location: [facility];  Service: Obstetrics;  Laterality: N/A;  request RNFA   HYSTEROSCOPY WITH D & C N/A 11/24/2022   Procedure: DILATATION AND CURETTAGE /HYSTEROSCOPY;  Surgeon: Lennart Pall, MD;  Location: [facility] [place];  Service: Gynecology;  Laterality: N/A;   LASER ABLATION OF THE CERVIX  2009   MOLE REMOVAL     TUBAL LIGATION      OB History     Gravida  4   Para  3   Term  3   Preterm      AB  1   Living  3      SAB  1   IAB      Ectopic      Multiple  0   Live Births  2             Home Medications    Prior to Admission medications   Medication Sig Start Date End Date Taking? Authorizing Provider  aspirin EC 81 MG tablet Take 81 mg by mouth daily. Swallow whole.   Yes [provider]  Biotin w/ Vitamins C & E (HAIR SKIN & NAILS GUMMIES PO)    Yes [provider]  folic acid (FOLVITE) 1 MG tablet Take 1 tablet (1 mg total) by mouth daily. 02/26/23  Yes Covington, Sarah M, PA-C  hydrOXYzine (ATARAX) 25 MG tablet Take 1 tablet (25 mg total) by mouth every 6 (six) hours as needed. 04/30/23  Yes Rinaldo Ratel, [place] N, FNP  predniSONE (STERAPRED UNI-PAK 21 TAB) 10 MG (21) TBPK tablet Take by mouth daily. Take 6 tabs by mouth daily  for 2 days, then 5 tabs for 2 days, then 4 tabs for 2 days, then 3 tabs for 2 days, 2 tabs for 2 days, then 1 tab by mouth daily for 2 days 04/30/23  Yes Rinaldo Ratel, [place] N, FNP  Vitamin D, Ergocalciferol, (DRISDOL) 1.25 MG (50000 UNIT) CAPS capsule Take 50,000 Units by mouth once a week. 12/01/22  Yes [provider]  ketoconazole (NIZORAL) 2 % shampoo Apply 1 Application topically 2 (two) times a week. 12/01/22   [provider]    Family History Family History  Problem Relation Age of Onset   Hyperlipidemia Mother    Alcohol abuse Father    Heart attack Maternal Grandfather    Colon cancer Paternal Grandmother    Lung cancer Maternal Aunt    Hypertension Other     Social History Social History   Tobacco Use   Smoking status: Never   Smokeless tobacco: Never  Vaping Use   Vaping status: Never Used  Substance Use Topics   Alcohol use: Yes    Alcohol/week: 1.0 standard drink of alcohol    Types: 1 Standard drinks or equivalent per week    Comment: Rare   Drug use: Never     Allergies   Amoxicillin and Nitrofurantoin   Review of Systems Review of Systems  Per HPI   Physical Exam Triage Vital Signs ED Triage Vitals  Encounter Vitals Group     BP 04/30/23 1823 125/80     Systolic BP  Percentile --      Diastolic BP Percentile --      Pulse Rate 04/30/23 1823 83     Resp 04/30/23 1823 18     Temp 04/30/23 1823 98.2 F (36.8 C)  Temp Source 04/30/23 1823 Oral     SpO2 04/30/23 1823 99 %     Weight 04/30/23 1825 135 lb (61.2 kg)     Height 04/30/23 1825 5\' 2"  (1.575 m)     Head Circumference --      Peak Flow --      Pain Score 04/30/23 1825 8     Pain Loc --      Pain Education --      Exclude from Growth Chart --    No data found.  Updated Vital Signs BP 125/80 (BP Location: Left Arm)   Pulse 83   Temp 98.2 F (36.8 C) (Oral)   Resp 18   Ht 5\' 2"  (1.575 m)   Wt 135 lb (61.2 kg)   LMP 04/20/2023   SpO2 99%   BMI 24.69 kg/m   Visual Acuity Right Eye Distance:   Left Eye Distance:   Bilateral Distance:    Right Eye Near:   Left Eye Near:    Bilateral Near:     Physical Exam Vitals and nursing note reviewed.  Constitutional:      Appearance: Normal appearance.  HENT:     Head: Normocephalic and atraumatic.     Right Ear: External ear normal.     Left Ear: External ear normal.     Nose: Nose normal.     Mouth/Throat:     Mouth: Mucous membranes are moist.  Eyes:     Conjunctiva/sclera: Conjunctivae normal.  Cardiovascular:     Rate and Rhythm: Normal rate and regular rhythm.     Heart sounds: Normal heart sounds. No murmur heard. Musculoskeletal:        General: Normal range of motion.  Skin:    General: Skin is warm and dry.     Findings: Rash present. Rash is urticarial.     Comments: Erythematous maculopapular rash present to neck, chest and groin.  palms and soles of feet w/ urticaria, no obvious rash or lesions  Neurological:     General: No focal deficit present.     Mental Status: She is alert.  Psychiatric:        Mood and Affect: Mood normal.        Behavior: Behavior is cooperative.      UC Treatments / Results  Labs (all labs ordered are listed, but only abnormal results are displayed) Labs Reviewed - No data  to display  EKG   Radiology No results found.  Procedures Procedures (including critical care time)  Medications Ordered in UC Medications - No data to display  Initial Impression / Assessment and Plan / UC Course  I have reviewed the triage vital signs and the nursing notes.  Pertinent labs & imaging results that were available during my care of the patient were reviewed by me and considered in my medical decision making (see chart for details).  Vitals and triage reviewed, patient is hemodynamically stable.  Lungs are vesicular, heart with regular rate and rhythm.  Rash presentation very similar to visit in September 2024.  Urticarial rash that is widespread, appears to be related to anxiety.  Hydroxyzine has worked well for anxiety in the past, this should work well for itching as well.  Will prescribe steroid taper, as she had success with this previously.  Encouraged primary care versus dermatology follow-up if anxiety and rash persist.  Plan of care, follow-up care return precautions given, no questions at this time.     Final Clinical Impressions(s) /  UC Diagnoses   Final diagnoses:  Rash and nonspecific skin eruption     Discharge Instructions      Your itchy rash may be stress-induced.  You can take the hydroxyzine every 6 hours as needed, this may cause sedation or drowsiness I recommend taking it before bed.  Start the steroid taper pack tomorrow and take this with breakfast.   You can continue to take the hydroxyzine as needed for any breakthrough anxiety.  Please follow-up with your primary care provider and consider following up with a therapist for further anxiety management.  Ensure you are using hypoallergenic soaps, lotions and detergents.  Dove sensitive skin is a good soap, Tide Free and clear is a good laundry detergent and Lubriderm or Cetaphil are good lotions and cleansers.  Seek immediate care for any wheezing or shortness of breath.  Follow-up with  your primary care provider or return to clinic if rash persist despite these interventions.     ED Prescriptions     Medication Sig Dispense Auth. Provider   hydrOXYzine (ATARAX) 25 MG tablet Take 1 tablet (25 mg total) by mouth every 6 (six) hours as needed. 30 tablet Rinaldo Ratel, [place] N, [place]   predniSONE (STERAPRED UNI-PAK 21 TAB) 10 MG (21) TBPK tablet Take by mouth daily. Take 6 tabs by mouth daily  for 2 days, then 5 tabs for 2 days, then 4 tabs for 2 days, then 3 tabs for 2 days, 2 tabs for 2 days, then 1 tab by mouth daily for 2 days 42 tablet Rinaldo Ratel, [place] N, [place]      PDMP not reviewed this encounter.   Rinaldo Ratel [place] N, [place] 04/30/23 [phone]

## 2023-04-30 NOTE — Discharge Instructions (Addendum)
 Your itchy rash may be stress-induced.  You can take the hydroxyzine every 6 hours as needed, this may cause sedation or drowsiness I recommend taking it before bed.  Start the steroid taper pack tomorrow and take this with breakfast.   You can continue to take the hydroxyzine as needed for any breakthrough anxiety.  Please follow-up with your primary care provider and consider following up with a therapist for further anxiety management.  Ensure you are using hypoallergenic soaps, lotions and detergents.  Dove sensitive skin is a good soap, Tide Free and clear is a good laundry detergent and Lubriderm or Cetaphil are good lotions and cleansers.  Seek immediate care for any wheezing or shortness of breath.  Follow-up with your primary care provider or return to clinic if rash persist despite these interventions.

## 2023-10-01 ENCOUNTER — Inpatient Hospital Stay: Payer: 59

## 2023-10-01 ENCOUNTER — Inpatient Hospital Stay: Payer: 59 | Admitting: Medical Oncology

## 2023-10-06 ENCOUNTER — Inpatient Hospital Stay (HOSPITAL_BASED_OUTPATIENT_CLINIC_OR_DEPARTMENT_OTHER): Admitting: Medical Oncology

## 2023-10-06 ENCOUNTER — Inpatient Hospital Stay: Attending: Hematology & Oncology

## 2023-10-06 VITALS — BP 112/69 | HR 78 | Temp 98.4°F | Resp 18 | Ht 62.0 in | Wt 139.1 lb

## 2023-10-06 DIAGNOSIS — E611 Iron deficiency: Secondary | ICD-10-CM | POA: Diagnosis present

## 2023-10-06 DIAGNOSIS — E7212 Methylenetetrahydrofolate reductase deficiency: Secondary | ICD-10-CM | POA: Diagnosis not present

## 2023-10-06 DIAGNOSIS — D6859 Other primary thrombophilia: Secondary | ICD-10-CM | POA: Insufficient documentation

## 2023-10-06 DIAGNOSIS — D500 Iron deficiency anemia secondary to blood loss (chronic): Secondary | ICD-10-CM

## 2023-10-06 DIAGNOSIS — R760 Raised antibody titer: Secondary | ICD-10-CM

## 2023-10-06 DIAGNOSIS — D611 Drug-induced aplastic anemia: Secondary | ICD-10-CM | POA: Insufficient documentation

## 2023-10-06 LAB — CBC
HCT: 43.2 % (ref 36.0–46.0)
Hemoglobin: 14.2 g/dL (ref 12.0–15.0)
MCH: 30 pg (ref 26.0–34.0)
MCHC: 32.9 g/dL (ref 30.0–36.0)
MCV: 91.1 fL (ref 80.0–100.0)
Platelets: 205 K/uL (ref 150–400)
RBC: 4.74 MIL/uL (ref 3.87–5.11)
RDW: 12 % (ref 11.5–15.5)
WBC: 7.5 K/uL (ref 4.0–10.5)
nRBC: 0 % (ref 0.0–0.2)

## 2023-10-06 LAB — RETIC PANEL
Immature Retic Fract: 3.4 % (ref 2.3–15.9)
RBC.: 4.73 MIL/uL (ref 3.87–5.11)
Retic Count, Absolute: 46.4 K/uL (ref 19.0–186.0)
Retic Ct Pct: 1 % (ref 0.4–3.1)
Reticulocyte Hemoglobin: 33.5 pg (ref >27.9)

## 2023-10-06 LAB — IRON AND IRON BINDING CAPACITY (CC-WL,HP ONLY)
Iron: 67 ug/dL (ref 28–170)
Saturation Ratios: 18 % (ref 10.4–31.8)
TIBC: 381 ug/dL (ref 250–450)
UIBC: 314 ug/dL

## 2023-10-06 LAB — FERRITIN: Ferritin: 61 ng/mL (ref 11–307)

## 2023-10-06 NOTE — Progress Notes (Signed)
 Hematology and Oncology Follow Up Visit  Jacqueline Huerta [id number] 1980/09/18 43 y.o. 10/06/2023   Principle Diagnosis:  MTHFR mutation heterozygous for the A1298C variant  Low protein S History of 1 miscarriage  Iron  deficiency   Current Therapy:   EC 81 mg aspirin PO daily IV iron  as indicated - Venofer - last dose 08/18/2022   Interim History: Jacqueline Huerta is here today for follow-up.   Today she states that she has been doing ok. Had a D&C in Oct 2024. Menstrual cycles are back and heavy. She is working with GYN to determine if she wants to proceed forward with a hysterectomy  No bruising or petechiae.  No fever, chills, n/v, cough, rash, SOB, chest pain, palpitations, abdominal pain or changes in bowel or bladder habits. No swelling, tenderness, numbness or tingling in her extremities.  No falls or syncope reported.   Appetite and hydration are good.   No family history of clotting diseases.   ECOG Performance Status: 1 - Symptomatic but completely ambulatory  Medications:  Allergies as of 10/06/2023       Reactions   Amoxicillin  Nausea Only   Nitrofurantoin Nausea And Vomiting        Medication List        Accurate as of October 06, 2023  3:55 PM. If you have any questions, ask your nurse or doctor.          aspirin EC 81 MG tablet Take 81 mg by mouth daily. Swallow whole.   cholecalciferol 25 MCG (1000 UNIT) tablet Commonly known as: VITAMIN D3 Take 1,000 Units by mouth daily.   folic acid  1 MG tablet Commonly known as: FOLVITE  Take 1 tablet (1 mg total) by mouth daily.   HAIR SKIN & NAILS GUMMIES PO   hydrOXYzine  25 MG tablet Commonly known as: ATARAX  Take 1 tablet (25 mg total) by mouth every 6 (six) hours as needed.   ketoconazole 2 % shampoo Commonly known as: NIZORAL Apply 1 Application topically 2 (two) times a week.   predniSONE  10 MG (21) Tbpk tablet Commonly known as: STERAPRED UNI-PAK 21 TAB Take by mouth daily. Take 6  tabs by mouth daily  for 2 days, then 5 tabs for 2 days, then 4 tabs for 2 days, then 3 tabs for 2 days, 2 tabs for 2 days, then 1 tab by mouth daily for 2 days   Vitamin D  (Ergocalciferol ) 1.25 MG (50000 UNIT) Caps capsule Commonly known as: DRISDOL  Take 50,000 Units by mouth once a week.        Allergies:  Allergies  Allergen Reactions   Amoxicillin  Nausea Only   Nitrofurantoin Nausea And Vomiting    Past Medical History, Surgical history, Social history, and Family History were reviewed and updated.  Review of Systems: All other 10 point review of systems is negative.   Physical Exam:  height is 5' 2 (1.575 m) and weight is 139 lb 1.9 oz (63.1 kg). Her oral temperature is 98.4 F (36.9 C). Her blood pressure is 112/69 and her pulse is 78. Her respiration is 18 and oxygen saturation is 100%.   Wt Readings from Last 3 Encounters:  10/06/23 139 lb 1.9 oz (63.1 kg)  04/30/23 135 lb (61.2 kg)  04/01/23 136 lb (61.7 kg)    Ocular: Sclerae unicteric, pupils equal, round and reactive to light Ear-nose-throat: Oropharynx clear, dentition fair Lymphatic: No cervical or supraclavicular adenopathy Lungs no rales or rhonchi, good excursion bilaterally Heart regular rate and rhythm, no murmur  appreciated Abd soft, nontender, positive bowel sounds MSK no focal spinal tenderness, no joint edema Neuro: non-focal, well-oriented, appropriate affect   Lab Results  Component Value Date   WBC 7.5 10/06/2023   HGB 14.2 10/06/2023   HCT 43.2 10/06/2023   MCV 91.1 10/06/2023   PLT 205 10/06/2023   Lab Results  Component Value Date   FERRITIN 72 04/01/2023   IRON 58 04/01/2023   TIBC 346 04/01/2023   UIBC 288 04/01/2023   IRONPCTSAT 17 04/01/2023   Lab Results  Component Value Date   RETICCTPCT 1.0 10/06/2023   RBC 4.73 10/06/2023   No results found for: KPAFRELGTCHN, LAMBDASER, KAPLAMBRATIO No results found for: IGGSERUM, IGA, IGMSERUM No results found for:  STEPHANY CARLOTA BENSON MARKEL EARLA JOANNIE DOC VICK, SPEI   Chemistry      Component Value Date/Time   NA 142 07/23/2022 1429   NA 141 02/20/2022 1542   K 4.2 07/23/2022 1429   CL 101 07/23/2022 1429   CO2 31 07/23/2022 1429   BUN 14 07/23/2022 1429   BUN 9 02/20/2022 1542   CREATININE 0.76 07/23/2022 1429   CREATININE 0.80 04/16/2018 1003      Component Value Date/Time   CALCIUM 9.6 07/23/2022 1429   ALKPHOS 56 07/23/2022 1429   AST 13 (L) 07/23/2022 1429   ALT 11 07/23/2022 1429   BILITOT 0.3 07/23/2022 1429      Encounter Diagnoses  Name Primary?   Iron deficiency anemia due to chronic blood loss Yes   Protein S deficiency (HCC)    Antiphospholipid antibody positive     Impression and Plan: Jacqueline Huerta is a very pleasant 43 yo caucasian female with MTHFR mutation heterozygous 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   Hgb today is nice and stable at 14.2 Retic normal Iron studied are pending. We will replace if needed.    RTC 6 months APP, labs (CBC, iron, ferritin)-[place]  Jacqueline Huerta, [place] 8/12/20253:55 PM

## 2023-10-08 ENCOUNTER — Ambulatory Visit: Payer: Self-pay | Admitting: Medical Oncology

## 2024-02-15 ENCOUNTER — Telehealth: Payer: Self-pay | Admitting: *Deleted

## 2024-02-15 NOTE — Telephone Encounter (Signed)
 Received a call from patient stating that she started the ASA daily and since then has had nose bleeds that are severe in nature and hard to stop.  She complains of dizziness in getting up.  Lauraine Dais notified. Patient appt made for tomorrow for lab, APP appt and IV iron  if needed

## 2024-02-16 ENCOUNTER — Inpatient Hospital Stay: Attending: Hematology & Oncology

## 2024-02-16 ENCOUNTER — Other Ambulatory Visit: Payer: Self-pay | Admitting: *Deleted

## 2024-02-16 ENCOUNTER — Other Ambulatory Visit: Payer: Self-pay

## 2024-02-16 ENCOUNTER — Inpatient Hospital Stay

## 2024-02-16 ENCOUNTER — Ambulatory Visit: Payer: Self-pay | Admitting: Hematology & Oncology

## 2024-02-16 ENCOUNTER — Inpatient Hospital Stay: Admitting: Medical Oncology

## 2024-02-16 VITALS — BP 122/82 | HR 81 | Temp 98.0°F | Resp 17 | Ht 62.0 in | Wt 140.0 lb

## 2024-02-16 DIAGNOSIS — R519 Headache, unspecified: Secondary | ICD-10-CM | POA: Diagnosis not present

## 2024-02-16 DIAGNOSIS — E7212 Methylenetetrahydrofolate reductase deficiency: Secondary | ICD-10-CM | POA: Insufficient documentation

## 2024-02-16 DIAGNOSIS — D500 Iron deficiency anemia secondary to blood loss (chronic): Secondary | ICD-10-CM

## 2024-02-16 DIAGNOSIS — G8929 Other chronic pain: Secondary | ICD-10-CM | POA: Diagnosis not present

## 2024-02-16 DIAGNOSIS — R040 Epistaxis: Secondary | ICD-10-CM | POA: Diagnosis not present

## 2024-02-16 DIAGNOSIS — R760 Raised antibody titer: Secondary | ICD-10-CM

## 2024-02-16 DIAGNOSIS — D6859 Other primary thrombophilia: Secondary | ICD-10-CM

## 2024-02-16 DIAGNOSIS — E611 Iron deficiency: Secondary | ICD-10-CM | POA: Insufficient documentation

## 2024-02-16 LAB — COMPREHENSIVE METABOLIC PANEL WITH GFR
ALT: 11 U/L (ref 0–44)
AST: 18 U/L (ref 15–41)
Albumin: 4.5 g/dL (ref 3.5–5.0)
Alkaline Phosphatase: 61 U/L (ref 38–126)
Anion gap: 10 (ref 5–15)
BUN: 9 mg/dL (ref 6–20)
CO2: 26 mmol/L (ref 22–32)
Calcium: 9 mg/dL (ref 8.9–10.3)
Chloride: 104 mmol/L (ref 98–111)
Creatinine, Ser: 0.72 mg/dL (ref 0.44–1.00)
GFR, Estimated: >60 mL/min
Glucose, Bld: 87 mg/dL (ref 70–99)
Potassium: 4.2 mmol/L (ref 3.5–5.1)
Sodium: 140 mmol/L (ref 135–145)
Total Bilirubin: 0.4 mg/dL (ref 0.0–1.2)
Total Protein: 6.7 g/dL (ref 6.5–8.1)

## 2024-02-16 LAB — FERRITIN: Ferritin: 35 ng/mL (ref 11–307)

## 2024-02-16 LAB — CBC WITH DIFFERENTIAL (CANCER CENTER ONLY)
Abs Immature Granulocytes: 0.01 K/uL (ref 0.00–0.07)
Basophils Absolute: 0 K/uL (ref 0.0–0.1)
Basophils Relative: 1 %
Eosinophils Absolute: 0.1 K/uL (ref 0.0–0.5)
Eosinophils Relative: 3 %
HCT: 38.2 % (ref 36.0–46.0)
Hemoglobin: 12.7 g/dL (ref 12.0–15.0)
Immature Granulocytes: 0 %
Lymphocytes Relative: 24 %
Lymphs Abs: 1.2 K/uL (ref 0.7–4.0)
MCH: 30 pg (ref 26.0–34.0)
MCHC: 33.2 g/dL (ref 30.0–36.0)
MCV: 90.3 fL (ref 80.0–100.0)
Monocytes Absolute: 0.5 K/uL (ref 0.1–1.0)
Monocytes Relative: 10 %
Neutro Abs: 3.1 K/uL (ref 1.7–7.7)
Neutrophils Relative %: 62 %
Platelet Count: 194 K/uL (ref 150–400)
RBC: 4.23 MIL/uL (ref 3.87–5.11)
RDW: 12.3 % (ref 11.5–15.5)
WBC Count: 5 K/uL (ref 4.0–10.5)
nRBC: 0 % (ref 0.0–0.2)

## 2024-02-16 LAB — RETICULOCYTES
Immature Retic Fract: 3.7 % (ref 2.3–15.9)
RBC.: 4.28 MIL/uL (ref 3.87–5.11)
Retic Count, Absolute: 42.4 K/uL (ref 19.0–186.0)
Retic Ct Pct: 1 % (ref 0.4–3.1)

## 2024-02-16 LAB — IRON AND IRON BINDING CAPACITY (CC-WL,HP ONLY)
Iron: 46 ug/dL (ref 28–170)
Saturation Ratios: 14 % (ref 10.4–31.8)
TIBC: 340 ug/dL (ref 250–450)
UIBC: 294 ug/dL

## 2024-02-16 NOTE — Progress Notes (Signed)
 " Hematology and Oncology Follow Up Visit  Jacqueline Huerta [id number] 04/28/1980 43 y.o. 02/16/2024   Principle Diagnosis:  MTHFR mutation heterozygous for the A1298C variant  Low protein S History of 1 miscarriage  Iron  deficiency   Current Therapy:   EC 81 mg aspirin PO daily IV iron  as indicated - Venofer - last dose 08/18/2022   Interim History: Jacqueline Huerta is here today for follow-up. She is here with her husband  She called in yesterday to alert me that she had been having substantial nosebleeds since around the time of starting the 81 mg aspirin. She denies other bleeding. Nosebleeds usually occur in the morning or when taking a hot bath. Both nostrils are involved at separate times.  She also reports continuation of her headaches and dizziness. Now having more episodes where her vision becomes blurry. She has been referred to Neurology in the past but has not yet been seen. She had a brain MR in 2024 for these symptoms which was normal. She has also seen ENT who suspected either menieres disease vs other.  Had a D&C in Oct 2024. Menstrual cycles are back and heavy. She is working with GYN to determine if she wants to proceed forward with a hysterectomy  No bruising or petechiae.  No fever, chills, n/v, cough, rash, SOB, chest pain, palpitations, abdominal pain or changes in bowel or bladder habits. No swelling, tenderness, numbness or tingling in her extremities.  No falls or syncope reported.   Appetite and hydration are good.   No family history of clotting diseases.   ECOG Performance Status: 1 - Symptomatic but completely ambulatory  Medications:  Allergies as of 02/16/2024       Reactions   Amoxicillin  Nausea Only   Nitrofurantoin Nausea And Vomiting        Medication List        Accurate as of February 16, 2024 10:32 AM. If you have any questions, ask your nurse or doctor.          aspirin EC 81 MG tablet Take 81 mg by mouth daily. Swallow  whole.   cholecalciferol 25 MCG (1000 UNIT) tablet Commonly known as: VITAMIN D3 Take 1,000 Units by mouth daily.   folic acid  1 MG tablet Commonly known as: FOLVITE  Take 1 tablet (1 mg total) by mouth daily.   HAIR SKIN & NAILS GUMMIES PO   hydrOXYzine  25 MG tablet Commonly known as: ATARAX  Take 1 tablet (25 mg total) by mouth every 6 (six) hours as needed.   ketoconazole 2 % shampoo Commonly known as: NIZORAL Apply 1 Application topically 2 (two) times a week.   predniSONE  10 MG (21) Tbpk tablet Commonly known as: STERAPRED UNI-PAK 21 TAB Take by mouth daily. Take 6 tabs by mouth daily  for 2 days, then 5 tabs for 2 days, then 4 tabs for 2 days, then 3 tabs for 2 days, 2 tabs for 2 days, then 1 tab by mouth daily for 2 days   Vitamin D  (Ergocalciferol ) 1.25 MG (50000 UNIT) Caps capsule Commonly known as: DRISDOL  Take 50,000 Units by mouth once a week.        Allergies:  Allergies  Allergen Reactions   Amoxicillin  Nausea Only   Nitrofurantoin Nausea And Vomiting    Past Medical History, Surgical history, Social history, and Family History were reviewed and updated.  Review of Systems: All other 10 point review of systems is negative.   Physical Exam:  height is 5' 2 (1.575 m)  and weight is 140 lb (63.5 kg). Her oral temperature is 98 F (36.7 C). Her blood pressure is 122/82 and her pulse is 81. Her respiration is 17 and oxygen saturation is 98%.   Wt Readings from Last 3 Encounters:  02/16/24 140 lb (63.5 kg)  10/06/23 139 lb 1.9 oz (63.1 kg)  04/30/23 135 lb (61.2 kg)    Ocular: Sclerae unicteric, pupils equal, round and reactive to light Ear-nose-throat: Oropharynx clear, dentition fair Lymphatic: No cervical or supraclavicular adenopathy Lungs no rales or rhonchi, good excursion bilaterally Heart regular rate and rhythm, no murmur appreciated Abd soft, nontender, positive bowel sounds MSK no focal spinal tenderness, no joint edema Neuro: non-focal,  well-oriented, appropriate affect   Lab Results  Component Value Date   WBC 5.0 02/16/2024   HGB 12.7 02/16/2024   HCT 38.2 02/16/2024   MCV 90.3 02/16/2024   PLT 194 02/16/2024   Lab Results  Component Value Date   FERRITIN 61 10/06/2023   IRON  67 10/06/2023   TIBC 381 10/06/2023   UIBC 314 10/06/2023   IRONPCTSAT 18 10/06/2023   Lab Results  Component Value Date   RETICCTPCT 1.0 02/16/2024   RBC 4.23 02/16/2024   RBC 4.28 02/16/2024   No results found for: KPAFRELGTCHN, LAMBDASER, KAPLAMBRATIO No results found for: IGGSERUM, IGA, IGMSERUM No results found for: STEPHANY CARLOTA BENSON MARKEL EARLA JOANNIE DOC VICK, SPEI   Chemistry      Component Value Date/Time   NA 140 02/16/2024 0911   NA 141 02/20/2022 1542   K 4.2 02/16/2024 0911   CL 104 02/16/2024 0911   CO2 26 02/16/2024 0911   BUN 9 02/16/2024 0911   BUN 9 02/20/2022 1542   CREATININE 0.72 02/16/2024 0911   CREATININE 0.76 07/23/2022 1429   CREATININE 0.80 04/16/2018 1003      Component Value Date/Time   CALCIUM 9.0 02/16/2024 0911   ALKPHOS 61 02/16/2024 0911   AST 18 02/16/2024 0911   AST 13 (L) 07/23/2022 1429   ALT 11 02/16/2024 0911   ALT 11 07/23/2022 1429   BILITOT 0.4 02/16/2024 0911   BILITOT 0.3 07/23/2022 1429      Encounter Diagnosis  Name Primary?   Chronic nonintractable headache, unspecified headache type Yes   Impression and Plan: Jacqueline Huerta is a very pleasant 43 yo caucasian female with MTHFR mutation heterozygous for the A1298C variant, protein S deficiency and iron  deficiency.   Hgb today is nice and stable at 14.2 Retic normal Iron  studied are pending. We will replace if needed.  Discussed return to ENT as she may need cautery of her nose. For now she will avoid digital trauma, will use a humidifier at night and can gently apply a small amount of Vaseline or Aquaphor on a Qtip into nose as needed. Bleeding precautions discussed.  Given that she is having more negative effects from the aspirin than suspected benefit we will have her hold her 81 mg aspirin.  Neurology referral place and we discussed red flag signs and symptoms- happy to see her normal brain MRI from 2024.   RTC 6 months APP, labs (CBC, iron , ferritin)-[place]  Lauraine CHRISTELLA Dais, PA-C 12/23/202510:32 AM  "

## 2024-02-17 ENCOUNTER — Encounter: Payer: Self-pay | Admitting: *Deleted

## 2024-02-17 ENCOUNTER — Encounter: Payer: Self-pay | Admitting: Family

## 2024-02-29 ENCOUNTER — Inpatient Hospital Stay: Attending: Hematology & Oncology

## 2024-02-29 VITALS — BP 98/67 | HR 70 | Temp 97.7°F | Resp 18

## 2024-02-29 DIAGNOSIS — D500 Iron deficiency anemia secondary to blood loss (chronic): Secondary | ICD-10-CM

## 2024-02-29 DIAGNOSIS — E611 Iron deficiency: Secondary | ICD-10-CM | POA: Insufficient documentation

## 2024-02-29 MED ORDER — IRON SUCROSE 300 MG IVPB - SIMPLE MED
300.0000 mg | Freq: Once | Status: AC
  Administered 2024-02-29: 300 mg via INTRAVENOUS
  Filled 2024-02-29: qty 300

## 2024-02-29 MED ORDER — SODIUM CHLORIDE 0.9 % IV SOLN: INTRAVENOUS | Status: DC

## 2024-02-29 NOTE — Patient Instructions (Signed)

## 2024-03-07 ENCOUNTER — Inpatient Hospital Stay

## 2024-03-07 VITALS — BP 97/85 | HR 70 | Temp 97.9°F | Resp 20

## 2024-03-07 DIAGNOSIS — D500 Iron deficiency anemia secondary to blood loss (chronic): Secondary | ICD-10-CM

## 2024-03-07 DIAGNOSIS — E611 Iron deficiency: Secondary | ICD-10-CM | POA: Diagnosis not present

## 2024-03-07 MED ORDER — SODIUM CHLORIDE 0.9 % IV SOLN: INTRAVENOUS | Status: DC

## 2024-03-07 MED ORDER — IRON SUCROSE 300 MG IVPB - SIMPLE MED
300.0000 mg | Freq: Once | Status: AC
  Administered 2024-03-07: 300 mg via INTRAVENOUS
  Filled 2024-03-07: qty 300

## 2024-03-07 NOTE — Progress Notes (Addendum)
 Pt. arrived for iron  infusion. BP low, occasional dizziness. Education completed.  Stated that she had stopped her baby ASA a couple of weeks ago per Lauraine Dais due to nose bleed. Nose bleed stopped a week after ASA stopped and has not had any  issues for a week. Relayed message to Lauraine. Ok to restart baby ASA with food. Called patient and notified of this.

## 2024-03-08 NOTE — Progress Notes (Signed)
 Otolaryngology Office Visit Note  Seen for evaluation and concern for a       problem.   Assessment and Plan Problem List Items Addressed This Visit       Other   Dizziness   Overview  possibly Meniere's without hearing symptoms.      Epistaxis - Primary      Concern for symptoms consistent with Meniere's. Audiogram was normal.  She had vertigo last year that responded to steroid shot.   She is concerned that her low iron  may be contributing. She has blood loss with her menstrual cycles. She has had profuse nose bleeds while on aspirin.   On examination, she has bilateral epistaxis of vessels of the anterior septum. Bilateral nasal cautery today with hemostasis achieved.   Plan: aftercare sheet given. Call if further questions or concerns.   NASAL CAUTERY  The risks and benefits of this procedure have been thoroughly discussed with the patient/parent.  The most commons risks outlined included but were not limited to: bleeding or infection.  The patient/parent was further informed that there are other less common risks.  The patient/parent was given the opportunity to ask questions and all such questions were answered to the patient/parent's satisfaction.  Patient/parent acknowledged the risks and has agreed to proceed.  Preop/Postop Diagnosis:  Bilateral Sided Epistaxis Procedure: With the patient seated in the exam chair, the nasal cavity was examined.  The nasal septum was anesthetized with topical lidocaine .   The bleeding area was cauterized with silver nitrate.   Tolerance: Excellent Unplanned interventions: None  Unplanned events: No complication     General  Date/Time: 03/08/2024 9:50 AM  Performed by: Odella CHRISTELLA Duverney, PA-C Authorized by: Odella CHRISTELLA Duverney, PA-C  Comments: Bilateral nasal cautery x1 on each side.       HPI Chief Complaint  Patient presents with   Vertigo   Nose Bleed    Iron  infusion on 03/07/24,Nose bleeds  happened 2 weeks ago     ROS See HPI  Allergies Allergies[1]  Current Meds Current Medications[2]  Active Problems Problem List[3]  PSH Surgical History[4]  Family Hx Family History[5]  Social Hx Social History   Socioeconomic History   Marital status: Married    Spouse name: Not on file   Number of children: Not on file   Years of education: Not on file   Highest education level: Not on file  Occupational History   Not on file  Tobacco Use   Smoking status: Not on file   Smokeless tobacco: Not on file  Substance and Sexual Activity   Alcohol use: Not on file   Drug use: Not on file   Sexual activity: Not on file  Other Topics Concern   Not on file  Social History Narrative   Not on file   Social Drivers of Health   Living Situation: Low Risk (05/25/2023)   Received from [facility] Situation    In the last 12 months, was there a time when you were not able to pay the mortgage or rent on time?: No    In the past 12 months, how many times have you moved where you were living?: 0    At any time in the past 12 months, were you homeless or living in a shelter (including now)?: No  Food Insecurity: No Food Insecurity (05/25/2023)   Received from [facility]   Food vital sign    Within the past 12 months, you worried  that your food would run out before you got money to buy more: Never true    Within the past 12 months, the food you bought just didn't last and you didn't have money to get more: Never true  Transportation Needs: No Transportation Needs (05/25/2023)   Received from [facility]   PRAPARE - Transportation    Lack of Transportation (Medical): No    Lack of Transportation (Non-Medical): No  Utilities: Not At Risk (05/25/2023)   Received from [facility] Utilities    Threatened with loss of utilities: No  Safety: Not At Risk (07/23/2022)   Received from [facility]   Safety    Within the last year, have you been afraid of your  partner or ex-partner?: No    Within the last year, have you been humiliated or emotionally abused in other ways by your partner or ex-partner?: No    Within the last year, have you been kicked, hit, slapped, or otherwise physically hurt by your partner or ex-partner?: No    Within the last year, have you been raped or forced to have any kind of sexual activity by your partner or ex-partner?: No  Alcohol Screening: Not on file  Tobacco Use: Low Risk (05/25/2023)   Received from [facility]   Patient History    Smoking Tobacco Use: Never    Smokeless Tobacco Use: Never    Passive Exposure: Never  Depression: Not At Risk (03/08/2024)   PHQ-2    PHQ-2 Score: 0  Social Connections: Not on file  Financial Resource Strain: Low Risk (05/25/2023)   Received from [facility]   Overall Financial Resource Strain (CARDIA)    Difficulty of Paying Living Expenses: Not hard at all    Vital Signs Vitals:   03/08/24 0956  BP: 129/87  Pulse: 95  Resp: 17  Temp: 97.7 F (36.5 C)  SpO2: 99%    Physical Exam   EXAMINATION  Constitutional General Appearance: Well appearing in no acute distress Ability to Communicate: Age appropriate Pain: None at this time  Head & Face Inspection:  Normocephalic  Eyes Ocular Motility: Extraocular muscles intact   Ears Right EXT: Normal Right ZJR:[id number] Right TM: clear and mobile Left EXT: Normal Left EAC: clear Left TM:  clear and mobile     Nose Ext Nose: Dorsum midline Intranasal: no lesions or purulence  Oral Cavity, Mouth  Pharynx Lips/Teeth/Gums:  Oral Mucosa:  Tongue/Floor of Mouth:  Palate/Uvula: clear, no exudates, vesicles, or erythema  Tonsils: non obstructive Posterior Pharynx:  Pharyngeal Walls:  Larynx:  Nasopharynx:   Cervical Neck: Normal appearance Thyroid :   Lymphatic Neck Nodes:    Respiratory/Cardiac      Neuro/Psych Cranial Nerves:  Orientation: Age appropriate Mood & Affect: Age appropriate  GI/GU   Skin No  facial lesions  Musculoskeletal   Extremities    PROCEDURE:      Odella HERO. Dittmer, PAC         [1] Allergies Allergen Reactions   Nitrofurantoin Nausea And Vomiting    Other reaction(s): GI Intolerance, Vomiting   Amoxicillin  Nausea Only  [2]  Current Outpatient Medications:    aspirin 81 mg EC tablet, Take 81 mg by mouth daily. (Patient not taking: Reported on 03/08/2024), Disp: , Rfl:  [3] Patient Active Problem List Diagnosis   Dizziness   Epistaxis  [4] No past surgical history on file. [5] No family history on file.

## 2024-03-16 ENCOUNTER — Encounter: Payer: Self-pay | Admitting: Family

## 2024-04-07 ENCOUNTER — Ambulatory Visit: Admitting: Medical Oncology

## 2024-04-07 ENCOUNTER — Inpatient Hospital Stay: Attending: Hematology & Oncology

## 2024-06-21 ENCOUNTER — Ambulatory Visit: Admitting: Neurology

## 2024-08-16 ENCOUNTER — Inpatient Hospital Stay: Admitting: Medical Oncology

## 2024-08-16 ENCOUNTER — Inpatient Hospital Stay: Attending: Hematology & Oncology

## 2025-02-21 ENCOUNTER — Encounter: Payer: 59 | Admitting: Family Medicine
